# Patient Record
Sex: Male | Born: 1990 | Race: Black or African American | Hispanic: No | Marital: Single | Smoking: Never smoker
Health system: Southern US, Community
[De-identification: ages and names within clinical notes are randomized; demographics above are authoritative.]

## PROBLEM LIST (undated history)

## (undated) DIAGNOSIS — T7840XA Allergy, unspecified, initial encounter: Secondary | ICD-10-CM

---
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
## 2014-12-14 ENCOUNTER — Emergency Department (HOSPITAL_COMMUNITY)
Admission: EM | Admit: 2014-12-14 | Discharge: 2014-12-14 | Disposition: A | Discharge status: Home / Self Care | Payer: Self-pay | Source: Home / Self Care

## 2014-12-14 ENCOUNTER — Encounter (HOSPITAL_COMMUNITY): Payer: Self-pay | Admitting: Emergency Medicine

## 2014-12-14 DIAGNOSIS — R51 Headache: Secondary | ICD-10-CM

## 2014-12-14 DIAGNOSIS — R519 Headache, unspecified: Secondary | ICD-10-CM

## 2014-12-14 NOTE — ED Notes (Addendum)
Pt has a dull pain along both cheek bones since Monday and it is now in the back of his head along his occipital.  Pt denies any trauma to the head.

## 2014-12-14 NOTE — ED Provider Notes (Signed)
CSN: [id number]     Arrival date & time 12/14/14  1028 History   None    Chief Complaint  Patient presents with  . Headache  . Facial Pain   (Consider location/radiation/quality/duration/timing/severity/associated sxs/prior Treatment) HPI    4 days ago developed facial pain around cheekbones. This resolved and then developed pain in the back of the head. Achy. Intermittent. No change since onset.  Lemon juice and nyquil w/ some improvement.  Denies other pain.  Stopped All caffeinated beverages a week ago. Latex water now.  Denies fevers, rhinorrhea, sinus congestion, postnasal drip, sore throat, cough, chest pain, neck stiffness, change in mentation, change in vision or hearing, rash, nausea, vomiting, diarrhea, photosensitivity, phonosensitivity. History reviewed. No pertinent past medical history. History reviewed. No pertinent past surgical history. Family History  Problem Relation Age of Onset  . Diabetes Other   . Hypertension Other    History  Substance Use Topics  . Smoking status: Never Smoker   . Smokeless tobacco: Never Used  . Alcohol Use: No    Review of Systems Per HPI with all other pertinent systems negative.   Allergies  Review of patient's allergies indicates no known allergies.  Home Medications   Prior to Admission medications   Not on File   BP 137/96 mmHg  Pulse 89  Temp(Src) 98.8 F (37.1 C) (Oral)  Resp 16  SpO2 99% Physical Exam Physical Exam  Constitutional: oriented to person, place, and time. appears well-developed and well-nourished. No distress.  HENT:  Head: Normocephalic and atraumatic.  Eyes: EOMI. PERRL.  Neck: Normal range of motion.  Cardiovascular: RRR, no m/r/g, 2+ distal pulses,  Pulmonary/Chest: Effort normal and breath sounds normal. No respiratory distress.  Abdominal: Soft. Bowel sounds are normal. NonTTP, no distension.  Musculoskeletal: Normal range of motion. Non ttp, no effusion.  Neurological: Revealed first  to 12 grossly intact, moves all shoulders according to fashion. A O 3.  Skin: Skin is warm. No rash noted. non diaphoretic.  Psychiatric: normal mood and affect. behavior is normal. Judgment and thought content normal.   ED Course  Procedures (including critical care time) Labs Review Labs Reviewed - No data to display  Imaging Review No results found.   MDM   1. Headache, occipital    He is not immediately clear but likely due to a mild headache. Patient endorses preceding drinking 2 caffeinated beverages per day and then stop them cold [place] about a week ago. Suspect of his headache may be due to this. Patient to go home and consider adding down his dose of caffeine as well as trying some ibuprofen or Excedrin which has caffeine in it. No overt signs of intracranial process, or metabolic disorder causing symptoms. Patient ED if he gets worse. Patient to follow-up with his primary care physician    Ozella Rocksavid J Devika Dragovich, MD 12/14/14 1255

## 2014-12-14 NOTE — Discharge Instructions (Signed)
Because of her symptoms is likely from a headache. This may be migraine related. Please go home and take a full 800 mg of ibuprofen. Consider also taking a daily allergy pill if you develop further facial pain symptoms. Please follow-up the emergency room if he gets significantly worse. Please call your primary care physician for a routine checkup.

## 2017-01-21 ENCOUNTER — Encounter (HOSPITAL_BASED_OUTPATIENT_CLINIC_OR_DEPARTMENT_OTHER): Payer: Self-pay | Admitting: *Deleted

## 2017-01-21 ENCOUNTER — Emergency Department (HOSPITAL_BASED_OUTPATIENT_CLINIC_OR_DEPARTMENT_OTHER)
Admission: EM | Admit: 2017-01-21 | Discharge: 2017-01-21 | Disposition: A | Discharge status: Home / Self Care | Payer: BC Managed Care – PPO | Attending: Emergency Medicine | Admitting: Emergency Medicine

## 2017-01-21 DIAGNOSIS — H6122 Impacted cerumen, left ear: Secondary | ICD-10-CM | POA: Diagnosis not present

## 2017-01-21 DIAGNOSIS — H6192 Disorder of left external ear, unspecified: Secondary | ICD-10-CM | POA: Diagnosis present

## 2017-01-21 MED ORDER — NEOMYCIN-COLIST-HC-THONZONIUM 3.3-3-10-0.5 MG/ML OT SUSP
4.0000 [drp] | Freq: Four times a day (QID) | OTIC | Status: DC
  Administered 2017-01-21: 4 [drp] via OTIC
  Filled 2017-01-21: qty 5

## 2017-01-21 NOTE — ED Triage Notes (Signed)
Left ear stopped up x 3 hours

## 2017-01-21 NOTE — ED Provider Notes (Signed)
   [facility] Provider Note: Timothy Mack Timothy Kawahara, MD, FACEP  CSN: [id number] MRN: [id number] ARRIVAL: 01/21/17 at 0632 ROOM: [facility]   CHIEF COMPLAINT  Cerumen Impaction   HISTORY OF PRESENT ILLNESS  Timothy Mack is a 26 y.o. male who awoke about 3 hours ago with a sensation of his left ear being clogged with decreased hearing acuity. He denies pain. He denies drainage. He denies other complaint. He has no recent history of cerumen impaction although does remember having this as a child.   History reviewed. No pertinent past medical history.  No past surgical history on file.  Family History  Problem Relation Age of Onset  . Diabetes Other   . Hypertension Other     Social History  Substance Use Topics  . Smoking status: Never Smoker  . Smokeless tobacco: Never Used  . Alcohol use No    Prior to Admission medications   Not on File    Allergies Patient has no known allergies.   REVIEW OF SYSTEMS  Negative except as noted here or in the History of Present Illness.   PHYSICAL EXAMINATION  Initial Vital Signs Blood pressure (!) 143/102, pulse 81, temperature 98.4 F (36.9 C), temperature source Oral, resp. rate 16, height 5\' 10"  (1.778 m), weight 102.1 kg (225 lb), SpO2 99 %.  Examination General: Well-developed, well-nourished male in no acute distress; appearance consistent with age of record HENT: normocephalic; atraumatic; right TM normal; left TM obscured by cerumen plug Eyes: pupils equal, round and reactive to light; extraocular muscles intact Neck: supple Heart: regular rate and rhythm Lungs: clear to auscultation bilaterally Abdomen: soft; nondistended; nontender; no masses or hepatosplenomegaly; bowel sounds present Extremities: No deformity; full range of motion; pulses normal Neurologic: Awake, alert and oriented; motor function intact in all extremities and symmetric; no facial droop Skin: Warm and dry Psychiatric: Normal mood and  affect   RESULTS  Summary of this visit's results, reviewed by myself:   EKG Interpretation  Date/Time:    Ventricular Rate:    PR Interval:    QRS Duration:   QT Interval:    QTC Calculation:   R Axis:     Text Interpretation:        Laboratory Studies: No results found for this or any previous visit (from the past 24 hour(s)). Imaging Studies: No results found.  ED COURSE  Nursing notes and initial vitals signs, including pulse oximetry, reviewed.  Vitals:   01/21/17 0639  BP: (!) 143/102  Pulse: 81  Resp: 16  Temp: 98.4 F (36.9 C)  TempSrc: Oral  SpO2: 99%  Weight: 102.1 kg (225 lb)  Height: 5\' 10"  (1.778 m)    PROCEDURES   Cerumen impaction relieved after left ear irrigation by nursing staff. TM appears normal. Cortisporin Otic order to prevent postprocedural otitis externa.  ED DIAGNOSES     ICD-10-CM   1. Impacted cerumen of left ear H61.22        Timothy Mack, Timothy RuizJohn, MD 01/21/17 [phone]

## 2017-05-21 ENCOUNTER — Emergency Department (HOSPITAL_BASED_OUTPATIENT_CLINIC_OR_DEPARTMENT_OTHER): Payer: BC Managed Care – PPO

## 2017-05-21 ENCOUNTER — Encounter (HOSPITAL_BASED_OUTPATIENT_CLINIC_OR_DEPARTMENT_OTHER): Payer: Self-pay | Admitting: Emergency Medicine

## 2017-05-21 ENCOUNTER — Emergency Department (HOSPITAL_BASED_OUTPATIENT_CLINIC_OR_DEPARTMENT_OTHER)
Admission: EM | Admit: 2017-05-21 | Discharge: 2017-05-21 | Disposition: A | Discharge status: Home / Self Care | Payer: BC Managed Care – PPO | Attending: Emergency Medicine | Admitting: Emergency Medicine

## 2017-05-21 DIAGNOSIS — B349 Viral infection, unspecified: Secondary | ICD-10-CM | POA: Insufficient documentation

## 2017-05-21 DIAGNOSIS — R0789 Other chest pain: Secondary | ICD-10-CM | POA: Diagnosis not present

## 2017-05-21 DIAGNOSIS — J069 Acute upper respiratory infection, unspecified: Secondary | ICD-10-CM | POA: Diagnosis not present

## 2017-05-21 DIAGNOSIS — R05 Cough: Secondary | ICD-10-CM | POA: Diagnosis present

## 2017-05-21 DIAGNOSIS — B9789 Other viral agents as the cause of diseases classified elsewhere: Secondary | ICD-10-CM

## 2017-05-21 MED ORDER — IBUPROFEN 400 MG PO TABS
600.0000 mg | ORAL_TABLET | Freq: Once | ORAL | Status: AC
  Administered 2017-05-21: 600 mg via ORAL
  Filled 2017-05-21: qty 1

## 2017-05-21 NOTE — ED Provider Notes (Signed)
[facility] EMERGENCY DEPARTMENT Provider Note   CSN: [id number] Arrival date & time: 05/21/17  7829     History   Chief Complaint Chief Complaint  Patient presents with  . Cough    HPI  Timothy Mack is a 26 y.o. Male With no pertinent past medical history, who presents complaining of nasal congestion, dry cough and some chest discomfort since Sunday. Patient reports a few days earlier he had some sore throat which resolved but then he restarted getting this dry non-productive cough and nasal congestion worse on the right side than the left. Patient describes some chest discomfort on the left side near his shoulder, he describes it as soreness, but comes and goes, he's not been able to detect pattern, he says it's not associated with exertion or particular positions or movement. Pt reports he was able to workout at the gym Monday with no worsening in his pain. Patient  Reports he only feels short of breath when he gets into a fit of coughing, otherwise no shortness of breath. He denies any fevers or chills. Reports some associated sore throat, no ear pain. No known sick contacts. History is symptoms at home with warm tea, took one dose of DayQuil which helped. Denies lower extremity pain or swelling, recent travel or immobilization, history of PE or DVT, family or personal history of bleeding or clotting disorders, hemoptysis.        No past medical history on file.  There are no active problems to display for this patient.   No past surgical history on file.     Home Medications    Prior to Admission medications   Not on File    Family History Family History  Problem Relation Age of Onset  . Diabetes Other   . Hypertension Other     Social History Social History  Substance Use Topics  . Smoking status: Never Smoker  . Smokeless tobacco: Never Used  . Alcohol use No     Allergies   Patient has no known allergies.   Review of Systems Review  of Systems  Constitutional: Negative for chills and fever.  HENT: Positive for congestion, postnasal drip, rhinorrhea, sinus pressure and sore throat. Negative for ear discharge, ear pain, trouble swallowing and voice change.   Eyes: Negative for discharge and itching.  Respiratory: Positive for cough and shortness of breath. Negative for chest tightness.   Cardiovascular: Positive for chest pain.  Gastrointestinal: Negative for abdominal pain, diarrhea, nausea and vomiting.  Genitourinary: Negative for dysuria.  Musculoskeletal: Negative for arthralgias and myalgias.  Skin: Negative for rash.     Physical Exam Updated Vital Signs BP (!) 139/102   Pulse (!) 107   Temp 99 F (37.2 C)   Resp 20   Ht 5\' 10"  (1.778 m)   Wt 103.4 kg (228 lb)   SpO2 99%   BMI 32.71 kg/m   Physical Exam  Constitutional: He appears well-developed and well-nourished. No distress.  HENT:  Head: Normocephalic and atraumatic.  TMs clear with good landmarks, moderate nasal mucosa edema with clear rhinorrhea, posterior oropharynx clear and moist, with some erythema, no edema or exudates, uvula midline  Eyes: Right eye exhibits no discharge. Left eye exhibits no discharge.  Neck: Normal range of motion. Neck supple. No tracheal deviation present.  Cardiovascular: Normal rate, regular rhythm, normal heart sounds and intact distal pulses.   Pulmonary/Chest: Effort normal and breath sounds normal. No stridor. No respiratory distress. He has no wheezes.  He has no rales. He exhibits tenderness.  Mild chest tenderness on palpation over left apical chest, just below the shoulder  Abdominal: Soft. Bowel sounds are normal. He exhibits no distension. There is no tenderness. There is no guarding.  Musculoskeletal: He exhibits no edema or deformity.  Lymphadenopathy:    He has no cervical adenopathy.  Neurological: He is alert. Coordination normal.  Skin: Skin is warm and dry. Capillary refill takes less than 2  seconds. He is not diaphoretic.  Psychiatric: He has a normal mood and affect. His behavior is normal.  Nursing note and vitals reviewed.    ED Treatments / Results  Labs (all labs ordered are listed, but only abnormal results are displayed) Labs Reviewed - No data to display  EKG  EKG Interpretation  Date/Time:  Wednesday May 21 2017 08:58:07 EDT Ventricular Rate:  106 PR Interval:    QRS Duration: 92 QT Interval:  317 QTC Calculation: 421 R Axis:   39 Text Interpretation:  Sinus tachycardia Borderline T wave abnormalities No old tracing to compare Confirmed by Melene PlanFloyd, Dan [phone]([id number]) on 05/21/2017 9:18:59 AM       Radiology Dg Chest 2 View  Result Date: 05/21/2017 CLINICAL DATA:  Left-sided chest pain for several days EXAM: CHEST  2 VIEW COMPARISON:  02/27/2008 FINDINGS: The heart size and mediastinal contours are within normal limits. Both lungs are clear. The visualized skeletal structures are unremarkable. IMPRESSION: No active cardiopulmonary disease. Electronically Signed   By: Alcide CleverMark  Lukens M.D.   On: 05/21/2017 09:23    Procedures Procedures (including critical care time)  Medications Ordered in ED Medications  ibuprofen (ADVIL,MOTRIN) tablet 600 mg (600 mg Oral Given 05/21/17 0949)     Initial Impression / Assessment and Plan / ED Course  I have reviewed the triage vital signs and the nursing notes.  Pertinent labs & imaging results that were available during my care of the patient were reviewed by me and considered in my medical decision making (see chart for details).  Pt presents with cough, congestion and some chest discomfort. Pt is well-appearing, slightly tachycardic on initial evaluation, vitals otherwise normal, no evidence of respiratory distress, lungs CTA. Pt CXR negative for acute infiltrate. EKG shows sinus tach. Low suspicion for ACS given story, unlikely PE given minimal risk factors and no hypoxia.  Patients symptoms are consistent with URI,  likely viral etiology. Discussed that antibiotics are not indicated for viral infections. Tachycardia resolved with ibuprofen and reassurance. Pt will be discharged with symptomatic treatment.  Verbalizes understanding and is agreeable with plan. Pt is hemodynamically stable & in NAD prior to dc.   Final Clinical Impressions(s) / ED Diagnoses   Final diagnoses:  Viral URI with cough  Chest wall pain    New Prescriptions There are no discharge medications for this patient.    Dartha LodgeFord, Laetitia Schnepf N, PA-C 05/22/17 1111    Melene PlanFloyd, Dan, DO 05/22/17 [phone]

## 2017-05-21 NOTE — Discharge Instructions (Signed)
You can treat these symptoms with over-the-counter medications such as Sudafed, Flonase, Delsym cough syrup, and ibuprofen or Tylenol for pain or fevers. Symptoms are not improving by the weekend please schedule an appointment to follow-up with your family doctor. Please have your family doctor recheck your blood pressure at this time. If you develop worsening chest pain, persistent shortness of breath or other concerning symptoms please return to the emergency department for sooner reevaluation.

## 2017-05-21 NOTE — ED Notes (Signed)
ED Provider at bedside. 

## 2017-05-21 NOTE — ED Triage Notes (Signed)
Pt states he has nasal congestion, dry cough and some chest discomfort since Sunday.  No known fever.  No recent travel.

## 2017-06-26 ENCOUNTER — Encounter (HOSPITAL_BASED_OUTPATIENT_CLINIC_OR_DEPARTMENT_OTHER): Payer: Self-pay

## 2017-06-26 ENCOUNTER — Other Ambulatory Visit: Payer: Self-pay

## 2017-06-26 ENCOUNTER — Emergency Department (HOSPITAL_BASED_OUTPATIENT_CLINIC_OR_DEPARTMENT_OTHER): Payer: BC Managed Care – PPO

## 2017-06-26 ENCOUNTER — Emergency Department (HOSPITAL_BASED_OUTPATIENT_CLINIC_OR_DEPARTMENT_OTHER)
Admission: EM | Admit: 2017-06-26 | Discharge: 2017-06-26 | Disposition: A | Discharge status: Home / Self Care | Payer: BC Managed Care – PPO | Attending: Emergency Medicine | Admitting: Emergency Medicine

## 2017-06-26 DIAGNOSIS — J029 Acute pharyngitis, unspecified: Secondary | ICD-10-CM | POA: Diagnosis not present

## 2017-06-26 DIAGNOSIS — J40 Bronchitis, not specified as acute or chronic: Secondary | ICD-10-CM | POA: Insufficient documentation

## 2017-06-26 DIAGNOSIS — R079 Chest pain, unspecified: Secondary | ICD-10-CM | POA: Insufficient documentation

## 2017-06-26 DIAGNOSIS — R05 Cough: Secondary | ICD-10-CM | POA: Diagnosis present

## 2017-06-26 MED ORDER — AEROCHAMBER PLUS FLO-VU MEDIUM MISC
1.0000 | Freq: Once | Status: AC
  Administered 2017-06-26: 1
  Filled 2017-06-26: qty 1

## 2017-06-26 MED ORDER — PREDNISONE 10 MG PO TABS: 40.0000 mg | ORAL_TABLET | Freq: Every day | ORAL | 0 refills | Status: AC

## 2017-06-26 MED ORDER — DEXAMETHASONE SODIUM PHOSPHATE 10 MG/ML IJ SOLN
10.0000 mg | Freq: Once | INTRAMUSCULAR | Status: AC
  Administered 2017-06-26: 10 mg via INTRAMUSCULAR
  Filled 2017-06-26: qty 1

## 2017-06-26 MED ORDER — ALBUTEROL SULFATE HFA 108 (90 BASE) MCG/ACT IN AERS
1.0000 | INHALATION_SPRAY | Freq: Once | RESPIRATORY_TRACT | Status: AC
  Administered 2017-06-26: 1 via RESPIRATORY_TRACT
  Filled 2017-06-26: qty 6.7

## 2017-06-26 MED FILL — predniSONE 10 MG TABS: 10 | 5 days supply | Qty: 20 | Fill #0

## 2017-06-26 NOTE — ED Provider Notes (Signed)
[facility] EMERGENCY DEPARTMENT Provider Note   CSN: [id number] Arrival date & time: 06/26/17  1607     History   Chief Complaint Chief Complaint  Patient presents with  . Cough    HPI Timothy Mack is a 26 y.o. male presenting with cough.  Patient states that he developed URI symptoms about a month ago.  He had nasal congestion, sore throat, and cough.  Since then, all symptoms have resolved except the cough.  He states it is nonproductive, worse at night.  He noted some improvement with Robitussin the last several days.  He has not taken anything else for his symptoms.  He states today he had worsening left-sided chest pain with coughing and movement of his arm.  There is no pain at rest.  He denies fevers, chills, sore throat, ear pain, difficulty breathing, shortness of breath, nausea, vomiting, abdominal pain, urinary symptoms, abnormal bowel movements.  He has no other medical history, does not take medications daily.   HPI  History reviewed. No pertinent past medical history.  There are no active problems to display for this patient.   History reviewed. No pertinent surgical history.     Home Medications    Prior to Admission medications   Medication Sig Start Date End Date Taking? Authorizing Provider  predniSONE (DELTASONE) 10 MG tablet Take 4 tablets (40 mg total) by mouth daily for 5 days. 06/26/17 07/01/17  Chanin Frumkin, PA-C    Family History Family History  Problem Relation Age of Onset  . Diabetes Other   . Hypertension Other     Social History Social History   Tobacco Use  . Smoking status: Never Smoker  . Smokeless tobacco: Never Used  Substance Use Topics  . Alcohol use: No  . Drug use: No     Allergies   Patient has no known allergies.   Review of Systems Review of Systems  Constitutional: Negative for chills and fever.  Respiratory: Positive for cough. Negative for chest tightness and shortness of breath.     Cardiovascular: Positive for chest pain. Negative for palpitations and leg swelling.     Physical Exam Updated Vital Signs BP (!) 139/96 (BP Location: Left Arm)   Pulse 87   Temp 98.5 F (36.9 C) (Oral)   Resp 18   Ht 5\' 11"  (1.803 m)   Wt 98.4 kg (217 lb)   SpO2 100%   BMI 30.27 kg/m   Physical Exam  Constitutional: He is oriented to person, place, and time. He appears well-developed and well-nourished. No distress.  HENT:  Head: Normocephalic and atraumatic.  Right Ear: Tympanic membrane, external ear and ear canal normal.  Left Ear: Tympanic membrane, external ear and ear canal normal.  Nose: Mucosal edema present. Right sinus exhibits no maxillary sinus tenderness and no frontal sinus tenderness. Left sinus exhibits no maxillary sinus tenderness and no frontal sinus tenderness.  Mouth/Throat: Uvula is midline, oropharynx is clear and moist and mucous membranes are normal. No tonsillar exudate.  Nasal mucosal edema.  No frontal tenderness.  OP clear without tonsillar swelling or exudate.  Eyes: Conjunctivae and EOM are normal. Pupils are equal, round, and reactive to light.  Neck: Normal range of motion.  Cardiovascular: Normal rate, regular rhythm and intact distal pulses.  Pulmonary/Chest: Effort normal and breath sounds normal. He has no decreased breath sounds. He has no wheezes. He has no rhonchi. He has no rales. He exhibits tenderness.  Pt speaking in full sentences without difficulty.  Lung sounds clear in all fields. TTP of the L chest wall musculature and pain with movement of the L arm.   Abdominal: Soft. He exhibits no distension. There is no tenderness.  Musculoskeletal: Normal range of motion.  Lymphadenopathy:    He has no cervical adenopathy.  Neurological: He is alert and oriented to person, place, and time.  Skin: Skin is warm.  Psychiatric: He has a normal mood and affect.  Nursing note and vitals reviewed.    ED Treatments / Results  Labs (all labs  ordered are listed, but only abnormal results are displayed) Labs Reviewed - No data to display  EKG  EKG Interpretation None       Radiology Dg Chest 2 View  Result Date: 06/26/2017 CLINICAL DATA:  Cough for 1 month EXAM: CHEST  2 VIEW COMPARISON:  05/21/2017 FINDINGS: The heart size and mediastinal contours are within normal limits. Both lungs are clear. The visualized skeletal structures are unremarkable. IMPRESSION: No active cardiopulmonary disease. Electronically Signed   By: Alcide CleverMark  Lukens M.D.   On: 06/26/2017 16:43    Procedures Procedures (including critical care time)  Medications Ordered in ED Medications  dexamethasone (DECADRON) injection 10 mg (10 mg Intramuscular Given 06/26/17 1703)  albuterol (PROVENTIL HFA;VENTOLIN HFA) 108 (90 Base) MCG/ACT inhaler 1 puff (1 puff Inhalation Given 06/26/17 1709)  AEROCHAMBER PLUS FLO-VU MEDIUM MISC 1 each (1 each Other Given 06/26/17 1709)     Initial Impression / Assessment and Plan / ED Course  I have reviewed the triage vital signs and the nursing notes.  Pertinent labs & imaging results that were available during my care of the patient were reviewed by me and considered in my medical decision making (see chart for details).     Patient presenting with persistent cough.  Physical exam reassuring, patient is afebrile and appears nontoxic.  Pulmonary exam reassuring.  CXR negative. Doubt PE, pneumonia, strep, other bacterial infection, or peritonsillar abscess. TTP of chest musculature, and pain with movement of arm. Likely musculature, doubt ACS or cardiac etiology. Likely bornchitis.  Will treat with Decadron, prednisone prescription, albuterol, and patient to continue taking cough syrup.  Patient to follow-up with primary care as needed.  At this time, patient appears safe for discharge.  Return precautions given.  Patient states he understands and agrees to plan.   Final Clinical Impressions(s) / ED Diagnoses   Final  diagnoses:  Bronchitis    ED Discharge Orders        Ordered    predniSONE (DELTASONE) 10 MG tablet  Daily     06/26/17 1700       Alveria ApleyCaccavale, Sharona Rovner, PA-C 06/27/17 0037    Maia PlanLong, Joshua G, MD 06/27/17 1019

## 2017-06-26 NOTE — Discharge Instructions (Signed)
Take prednisone as prescribed. Use the inhaler 3 times a day for the next several days and as needed afterwards.  You likely have a viral illness, and the goal is to prevent coughing. Use Tylenol or ibuprofen as needed for fevers or body aches. Continue to take robitussin nightly. Make sure you stay well-hydrated with water. Wash your hands frequently to prevent spread of infection. Follow-up with your primary care doctor in 1 week if your symptoms are not improving. Return to the emergency room if you develop chest pain, difficulty breathing, or any new or worsening symptoms.

## 2017-06-26 NOTE — ED Notes (Signed)
ED Provider at bedside. 

## 2017-06-26 NOTE — ED Triage Notes (Signed)
C/o cont'd dry cough-seen here 10/31 for same-states he now feels has pulled muscle to left chest area-NAD-steady gait

## 2017-11-01 ENCOUNTER — Emergency Department (HOSPITAL_BASED_OUTPATIENT_CLINIC_OR_DEPARTMENT_OTHER)
Admission: EM | Admit: 2017-11-01 | Discharge: 2017-11-01 | Disposition: A | Discharge status: Home / Self Care | Payer: BC Managed Care – PPO | Attending: Emergency Medicine | Admitting: Emergency Medicine

## 2017-11-01 ENCOUNTER — Other Ambulatory Visit: Payer: Self-pay

## 2017-11-01 ENCOUNTER — Emergency Department (HOSPITAL_BASED_OUTPATIENT_CLINIC_OR_DEPARTMENT_OTHER): Payer: BC Managed Care – PPO

## 2017-11-01 ENCOUNTER — Encounter (HOSPITAL_BASED_OUTPATIENT_CLINIC_OR_DEPARTMENT_OTHER): Payer: Self-pay | Admitting: Adult Health

## 2017-11-01 DIAGNOSIS — R0789 Other chest pain: Secondary | ICD-10-CM | POA: Diagnosis not present

## 2017-11-01 DIAGNOSIS — R079 Chest pain, unspecified: Secondary | ICD-10-CM | POA: Diagnosis present

## 2017-11-01 LAB — BASIC METABOLIC PANEL
Anion gap: 8 (ref 5–15)
BUN: 11 mg/dL (ref 6–20)
CO2: 25 mmol/L (ref 22–32)
Calcium: 9 mg/dL (ref 8.9–10.3)
Chloride: 103 mmol/L (ref 101–111)
Creatinine, Ser: 1.1 mg/dL (ref 0.61–1.24)
GFR calc Af Amer: >60 mL/min (ref >60)
GLUCOSE: 103 mg/dL — AB (ref 65–99)
POTASSIUM: 3.5 mmol/L (ref 3.5–5.1)
Sodium: 136 mmol/L (ref 135–145)

## 2017-11-01 LAB — CBC WITH DIFFERENTIAL/PLATELET
BASOS ABS: 0.1 10*3/uL (ref 0.0–0.1)
BASOS PCT: 1 %
Eosinophils Absolute: 0.1 10*3/uL (ref 0.0–0.7)
Eosinophils Relative: 2 %
HEMATOCRIT: 47.3 % (ref 39.0–52.0)
Hemoglobin: 16.4 g/dL (ref 13.0–17.0)
Lymphocytes Relative: 39 %
Lymphs Abs: 2.6 10*3/uL (ref 0.7–4.0)
MCH: 30.1 pg (ref 26.0–34.0)
MCHC: 34.7 g/dL (ref 30.0–36.0)
MCV: 86.8 fL (ref 78.0–100.0)
MONOS PCT: 9 %
Monocytes Absolute: 0.6 10*3/uL (ref 0.1–1.0)
NEUTROS ABS: 3.2 10*3/uL (ref 1.7–7.7)
Neutrophils Relative %: 49 %
Platelets: 178 10*3/uL (ref 150–400)
RBC: 5.45 MIL/uL (ref 4.22–5.81)
RDW: 12.7 % (ref 11.5–15.5)
WBC: 6.6 10*3/uL (ref 4.0–10.5)

## 2017-11-01 LAB — TROPONIN I: Troponin I: <0.03 ng/mL (ref <0.03)

## 2017-11-01 LAB — D-DIMER, QUANTITATIVE (NOT AT ARMC)

## 2017-11-01 MED ORDER — CYCLOBENZAPRINE HCL 10 MG PO TABS: 10.0000 mg | ORAL_TABLET | Freq: Two times a day (BID) | ORAL | 0 refills | Status: DC | PRN

## 2017-11-01 MED ORDER — IBUPROFEN 600 MG PO TABS: 600.0000 mg | ORAL_TABLET | Freq: Four times a day (QID) | ORAL | 0 refills | Status: DC | PRN

## 2017-11-01 NOTE — ED Provider Notes (Signed)
[facility] EMERGENCY DEPARTMENT Provider Note   CSN: [id number] Arrival date & time: 11/01/17  2033     History   Chief Complaint Chief Complaint  Patient presents with  . Chest Pain    HPI Timothy Mack is a 27 y.o. male.  Pt presents to the ED today with chest pain.  Pt said it has been going on for about 2 weeks.  He said it is mainly in the left side.  It feels better with icy hot and with epsom salts, but it does not go away.  No sob.  No n/v.     History reviewed. No pertinent past medical history.  There are no active problems to display for this patient.   History reviewed. No pertinent surgical history.      Home Medications    Prior to Admission medications   Medication Sig Start Date End Date Taking? Authorizing Provider  cetirizine (ZYRTEC) 10 MG tablet Take 10 mg by mouth daily.   Yes [provider]  fluticasone (FLONASE) 50 MCG/ACT nasal spray Place 2 sprays into both nostrils daily.   Yes [provider]  cyclobenzaprine (FLEXERIL) 10 MG tablet Take 1 tablet (10 mg total) by mouth 2 (two) times daily as needed for muscle spasms. 11/01/17   Jacalyn Lefevre, MD  ibuprofen (ADVIL,MOTRIN) 600 MG tablet Take 1 tablet (600 mg total) by mouth every 6 (six) hours as needed. 11/01/17   Jacalyn Lefevre, MD    Family History Family History  Problem Relation Age of Onset  . Diabetes Other   . Hypertension Other     Social History Social History   Tobacco Use  . Smoking status: Never Smoker  . Smokeless tobacco: Never Used  Substance Use Topics  . Alcohol use: No  . Drug use: No     Allergies   Patient has no known allergies.   Review of Systems Review of Systems  Cardiovascular: Positive for chest pain.  All other systems reviewed and are negative.    Physical Exam Updated Vital Signs BP (!) 162/93   Pulse 90   Temp 98.1 F (36.7 C) (Oral)   Resp 20   Ht 5\' 11"  (1.803 m)   Wt 102.1 kg (225 lb)   SpO2  100%   BMI 31.38 kg/m   Physical Exam  Constitutional: He is oriented to person, place, and time. He appears well-developed and well-nourished.  HENT:  Head: Normocephalic and atraumatic.  Eyes: Pupils are equal, round, and reactive to light. EOM are normal.  Neck: Normal range of motion. Neck supple.  Cardiovascular: Normal rate, regular rhythm, intact distal pulses and normal pulses.  Pulmonary/Chest: Effort normal and breath sounds normal.  Abdominal: Soft. Bowel sounds are normal.  Musculoskeletal: Normal range of motion.       Right lower leg: Normal.       Left lower leg: Normal.  Neurological: He is alert and oriented to person, place, and time.  Skin: Skin is warm and dry. Capillary refill takes less than 2 seconds.  Psychiatric: He has a normal mood and affect. His behavior is normal.  Nursing note and vitals reviewed.    ED Treatments / Results  Labs (all labs ordered are listed, but only abnormal results are displayed) Labs Reviewed  BASIC METABOLIC PANEL - Abnormal; Notable for the following components:      Result Value   Glucose, Bld 103 (*)    All other components within normal limits  CBC WITH  DIFFERENTIAL/PLATELET  TROPONIN I  D-DIMER, QUANTITATIVE (NOT AT [facility])    EKG EKG Interpretation  Date/Time:  Saturday November 01 2017 20:39:07 EDT Ventricular Rate:  91 PR Interval:  144 QRS Duration: 94 QT Interval:  306 QTC Calculation: 376 R Axis:   49 Text Interpretation:  Normal sinus rhythm with sinus arrhythmia Nonspecific T wave abnormality Abnormal ECG No significant change since last tracing Confirmed by Jacalyn LefevreHaviland, Romeo Zielinski [phone]([id number]) on 11/01/2017 8:51:28 PM   Radiology Dg Chest 2 View  Result Date: 11/01/2017 CLINICAL DATA:  Chest pain EXAM: CHEST - 2 VIEW COMPARISON:  06/26/2017 FINDINGS: Lungs are clear.  No pleural effusion or pneumothorax. The heart is normal in size. Visualized osseous structures are within normal limits. IMPRESSION: Normal chest  radiographs. Electronically Signed   By: Charline BillsSriyesh  Krishnan M.D.   On: 11/01/2017 21:17    Procedures Procedures (including critical care time)  Medications Ordered in ED Medications - No data to display   Initial Impression / Assessment and Plan / ED Course  I have reviewed the triage vital signs and the nursing notes.  Pertinent labs & imaging results that were available during my care of the patient were reviewed by me and considered in my medical decision making (see chart for details).    Pt's work up is negative.  Pain is atypical.  Heart score of 0.  Pt is stable for d/c.  Return if worse.  F/u with pcp.  Final Clinical Impressions(s) / ED Diagnoses   Final diagnoses:  Atypical chest pain    ED Discharge Orders        Ordered    ibuprofen (ADVIL,MOTRIN) 600 MG tablet  Every 6 hours PRN     11/01/17 2219    cyclobenzaprine (FLEXERIL) 10 MG tablet  2 times daily PRN     11/01/17 2219       Jacalyn LefevreHaviland, Liset Mcmonigle, MD 11/01/17 2220

## 2017-11-01 NOTE — ED Notes (Signed)
Pt and mother given d/c instructions as per chart. Rx x 2 with precautions. Verbalizes understanding. No questions. 

## 2017-11-01 NOTE — ED Triage Notes (Signed)
PResents with fatigue and pain in the upper left flank and back, worse with deep inspiration,, made better with epsom salt and icy hot. He denies SOB, nausea and dizziness.

## 2017-11-16 ENCOUNTER — Emergency Department (HOSPITAL_BASED_OUTPATIENT_CLINIC_OR_DEPARTMENT_OTHER)
Admission: EM | Admit: 2017-11-16 | Discharge: 2017-11-16 | Disposition: A | Discharge status: Home / Self Care | Payer: BC Managed Care – PPO | Attending: Emergency Medicine | Admitting: Emergency Medicine

## 2017-11-16 ENCOUNTER — Encounter (HOSPITAL_BASED_OUTPATIENT_CLINIC_OR_DEPARTMENT_OTHER): Payer: Self-pay | Admitting: Emergency Medicine

## 2017-11-16 ENCOUNTER — Other Ambulatory Visit: Payer: Self-pay

## 2017-11-16 DIAGNOSIS — Y92410 Unspecified street and highway as the place of occurrence of the external cause: Secondary | ICD-10-CM | POA: Diagnosis not present

## 2017-11-16 DIAGNOSIS — Y9389 Activity, other specified: Secondary | ICD-10-CM | POA: Insufficient documentation

## 2017-11-16 DIAGNOSIS — Z79899 Other long term (current) drug therapy: Secondary | ICD-10-CM | POA: Diagnosis not present

## 2017-11-16 DIAGNOSIS — S161XXA Strain of muscle, fascia and tendon at neck level, initial encounter: Secondary | ICD-10-CM | POA: Insufficient documentation

## 2017-11-16 DIAGNOSIS — Y999 Unspecified external cause status: Secondary | ICD-10-CM | POA: Insufficient documentation

## 2017-11-16 DIAGNOSIS — R51 Headache: Secondary | ICD-10-CM | POA: Diagnosis not present

## 2017-11-16 DIAGNOSIS — S199XXA Unspecified injury of neck, initial encounter: Secondary | ICD-10-CM | POA: Diagnosis present

## 2017-11-16 MED ORDER — IBUPROFEN 800 MG PO TABS
800.0000 mg | ORAL_TABLET | Freq: Once | ORAL | Status: AC
  Administered 2017-11-16: 800 mg via ORAL
  Filled 2017-11-16: qty 1

## 2017-11-16 MED ORDER — CYCLOBENZAPRINE HCL 10 MG PO TABS: 10.0000 mg | ORAL_TABLET | Freq: Two times a day (BID) | ORAL | 0 refills | Status: DC | PRN

## 2017-11-16 MED ORDER — CYCLOBENZAPRINE HCL 10 MG PO TABS
10.0000 mg | ORAL_TABLET | Freq: Once | ORAL | Status: AC
  Administered 2017-11-16: 10 mg via ORAL
  Filled 2017-11-16: qty 1

## 2017-11-16 MED ORDER — IBUPROFEN 800 MG PO TABS: 800.0000 mg | ORAL_TABLET | Freq: Three times a day (TID) | ORAL | 0 refills | Status: DC
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
## 2018-03-01 ENCOUNTER — Emergency Department (HOSPITAL_BASED_OUTPATIENT_CLINIC_OR_DEPARTMENT_OTHER)
Admission: EM | Admit: 2018-03-01 | Discharge: 2018-03-01 | Disposition: A | Discharge status: Home / Self Care | Payer: BC Managed Care – PPO | Attending: Emergency Medicine | Admitting: Emergency Medicine

## 2018-03-01 ENCOUNTER — Other Ambulatory Visit: Payer: Self-pay

## 2018-03-01 ENCOUNTER — Encounter (HOSPITAL_BASED_OUTPATIENT_CLINIC_OR_DEPARTMENT_OTHER): Payer: Self-pay | Admitting: Emergency Medicine

## 2018-03-01 DIAGNOSIS — H1013 Acute atopic conjunctivitis, bilateral: Secondary | ICD-10-CM | POA: Diagnosis not present

## 2018-03-01 DIAGNOSIS — Z79899 Other long term (current) drug therapy: Secondary | ICD-10-CM | POA: Insufficient documentation

## 2018-03-01 DIAGNOSIS — H5789 Other specified disorders of eye and adnexa: Secondary | ICD-10-CM | POA: Diagnosis present

## 2018-03-01 NOTE — Discharge Instructions (Signed)
If you develop eye pain, blurry vision, matting of the eye or eye drainage that is not clear or any other new/concerning symptoms and return to the ER for evaluation.  Do not wear contacts during this time.

## 2018-03-01 NOTE — ED Provider Notes (Signed)
[facility] EMERGENCY DEPARTMENT Provider Note   CSN: [id number] Arrival date & time: 03/01/18  1905     History   Chief Complaint Chief Complaint  Patient presents with  . Eye Problem    HPI Dorin R Klemann is a 27 y.o. male.  HPI  27 year old male presents with eye redness.  Has been significant for the last 2 days and mostly right-sided.  However over the last couple weeks he has woken up with some eye redness bilaterally.  No fevers, headache, green or yellow discharge, matting.  No blurry vision.  No ocular pain but has been having some itching and watery discharge.  Some infraorbital swelling and some right maxillary sinus pressure.  Some nasal congestion and postnasal drip along with sneezing.  History reviewed. No pertinent past medical history.  There are no active problems to display for this patient.   History reviewed. No pertinent surgical history.      Home Medications    Prior to Admission medications   Medication Sig Start Date End Date Taking? Authorizing Provider  cetirizine (ZYRTEC) 10 MG tablet Take 10 mg by mouth daily.    [provider]  cyclobenzaprine (FLEXERIL) 10 MG tablet Take 1 tablet (10 mg total) by mouth 2 (two) times daily as needed for muscle spasms. 11/16/17   McDonald, Mia A, PA-C  fluticasone (FLONASE) 50 MCG/ACT nasal spray Place 2 sprays into both nostrils daily.    [provider]  ibuprofen (ADVIL,MOTRIN) 800 MG tablet Take 1 tablet (800 mg total) by mouth 3 (three) times daily. 11/16/17   McDonald, Mia A, PA-C    Family History Family History  Problem Relation Age of Onset  . Diabetes Other   . Hypertension Other     Social History Social History   Tobacco Use  . Smoking status: Never Smoker  . Smokeless tobacco: Never Used  Substance Use Topics  . Alcohol use: No  . Drug use: No     Allergies   Patient has no known allergies.   Review of Systems Review of Systems  Constitutional:  Negative for fever.  HENT: Positive for congestion, rhinorrhea, sneezing and sore throat.   Eyes: Positive for discharge (clear), redness and itching. Negative for pain and visual disturbance.  Respiratory: Negative for cough and shortness of breath.   Neurological: Negative for headaches.     Physical Exam Updated Vital Signs BP (!) 145/95 (BP Location: Left Arm)   Pulse 88   Temp 98.5 F (36.9 C) (Oral)   Resp 18   Ht 5\' 11"  (1.803 m)   Wt 95.3 kg   SpO2 100%   BMI 29.29 kg/m   Physical Exam  Constitutional: He appears well-developed and well-nourished.  HENT:  Head: Normocephalic and atraumatic.  Right Ear: External ear normal.  Left Ear: External ear normal.  Nose: Nose normal.  Eyes: Pupils are equal, round, and reactive to light. EOM are normal. Right eye exhibits no discharge. Left eye exhibits no discharge. Right conjunctiva is injected.    Mild right conjunctival injection medial to iris. No diffuse injection. No periorbital swelling or discharge  Neck: Neck supple.  Pulmonary/Chest: Effort normal.  Abdominal: He exhibits no distension.  Neurological: He is alert.  Skin: Skin is warm and dry. No erythema.  Nursing note and vitals reviewed.    ED Treatments / Results  Labs (all labs ordered are listed, but only abnormal results are displayed) Labs Reviewed - No data to display  EKG None  Radiology No results found.  Procedures Procedures (including critical care time)  Medications Ordered in ED Medications - No data to display   Initial Impression / Assessment and Plan / ED Course  I have reviewed the triage vital signs and the nursing notes.  Pertinent labs & imaging results that were available during my care of the patient were reviewed by me and considered in my medical decision making (see chart for details).     Minimal conjunctival injection.  His symptoms sound allergic versus mild URI.  Does not appear to be a bacterial conjunctivitis  and probably not even a viral conjunctivitis.  I discussed using antihistamines and other supportive care for his symptoms.  He is due to get an eye exam and so I have encouraged him to follow-up with his eye specialist and otherwise can follow-up with PCP.  Discussed return precautions but he has no pain, blurry vision, or other concerning signs or symptoms to suggest a true ophthalmologic emergency.  Final Clinical Impressions(s) / ED Diagnoses   Final diagnoses:  Allergic conjunctivitis of both eyes    ED Discharge Orders    None       Pricilla LovelessGoldston, Akif Weldy, MD 03/01/18 2203

## 2018-03-01 NOTE — ED Triage Notes (Signed)
Patient states that he has had intermittent eye redness to his bilateral eyes x 2 weeks - he reports that his right eye has been worse today

## 2019-06-05 IMAGING — DX DG CHEST 2V
2 series · 2 of 2 positions shown · non-contrast
Comparison: 06/26/2017

CLINICAL DATA: Chest pain

EXAM:
CHEST - 2 VIEW

[chest pa]
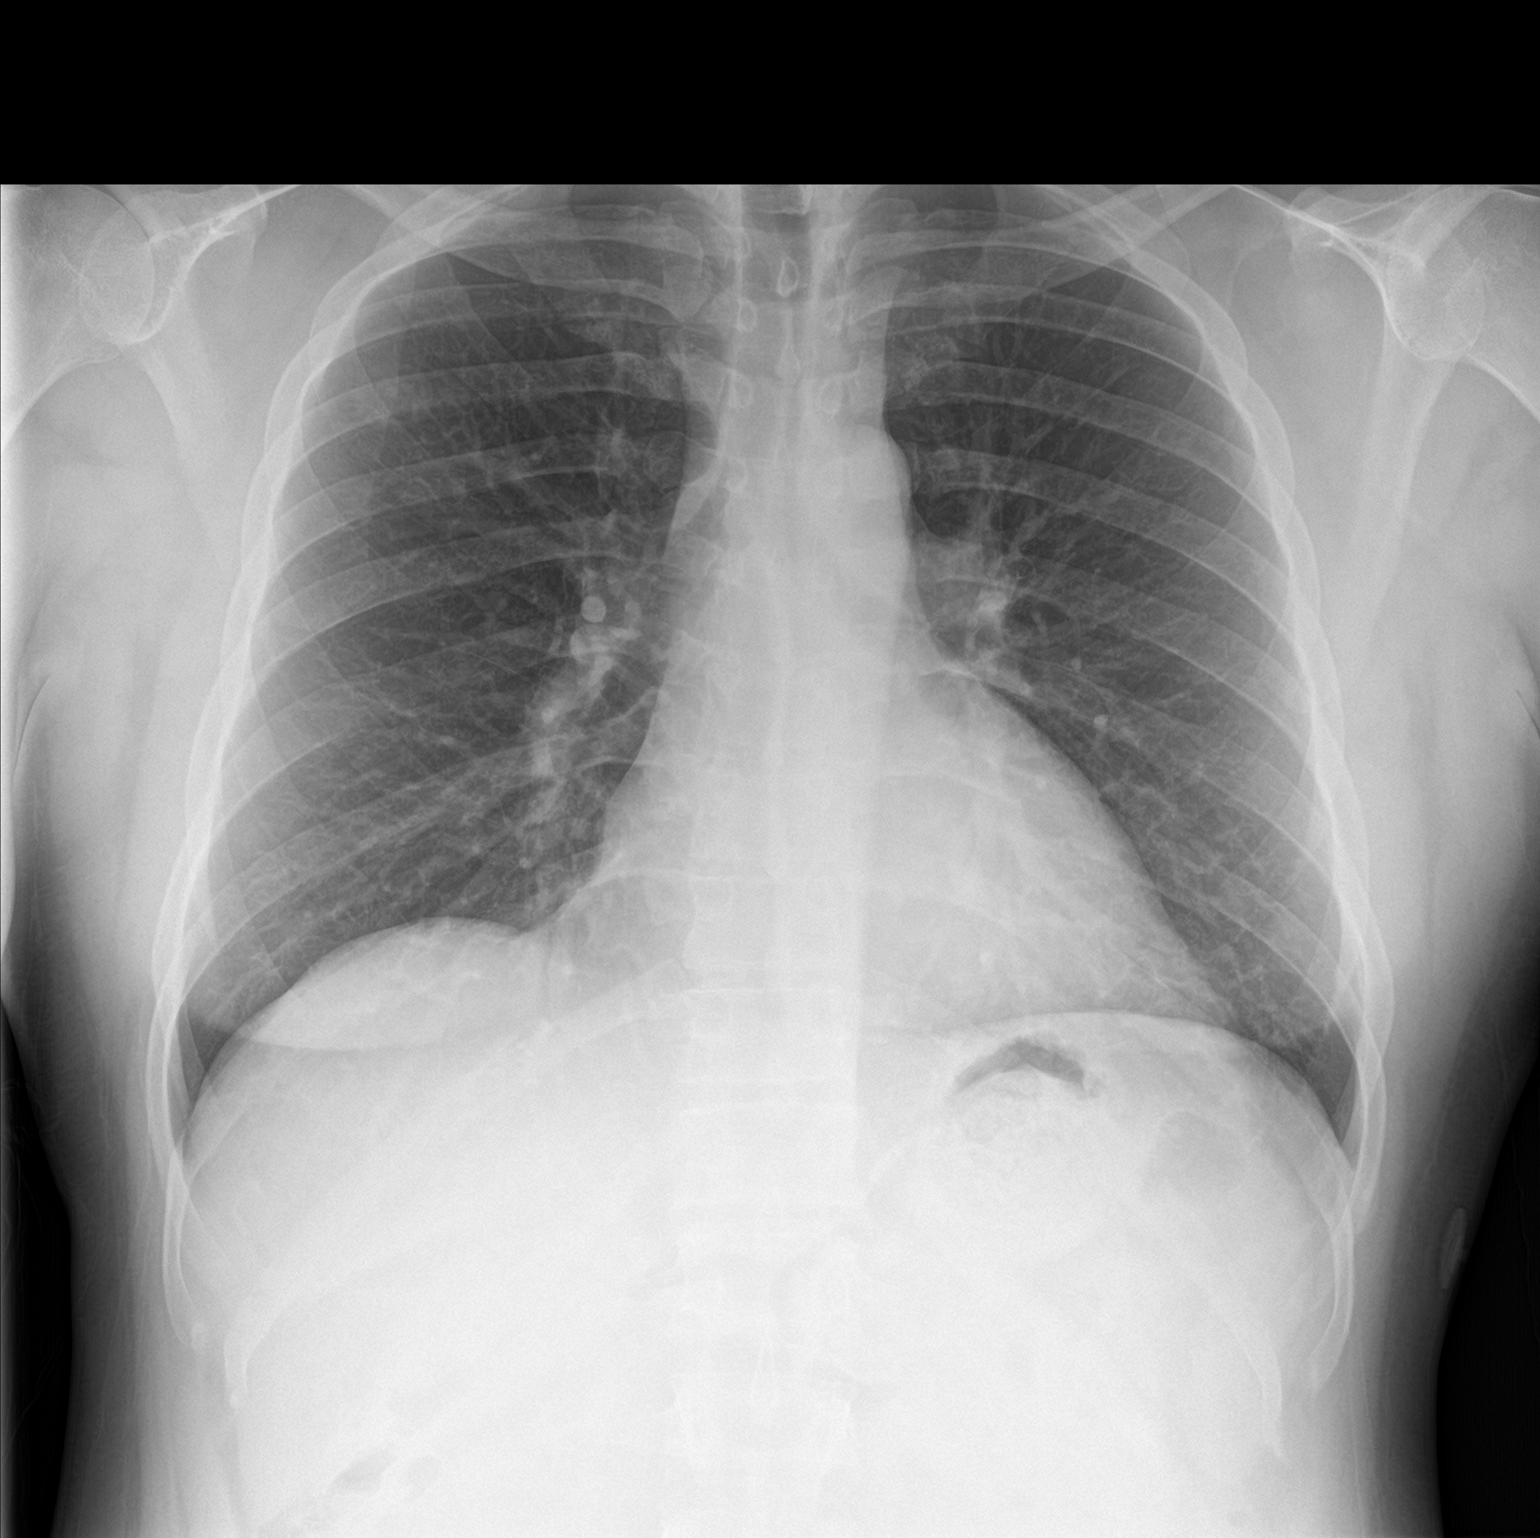

[chest lat]
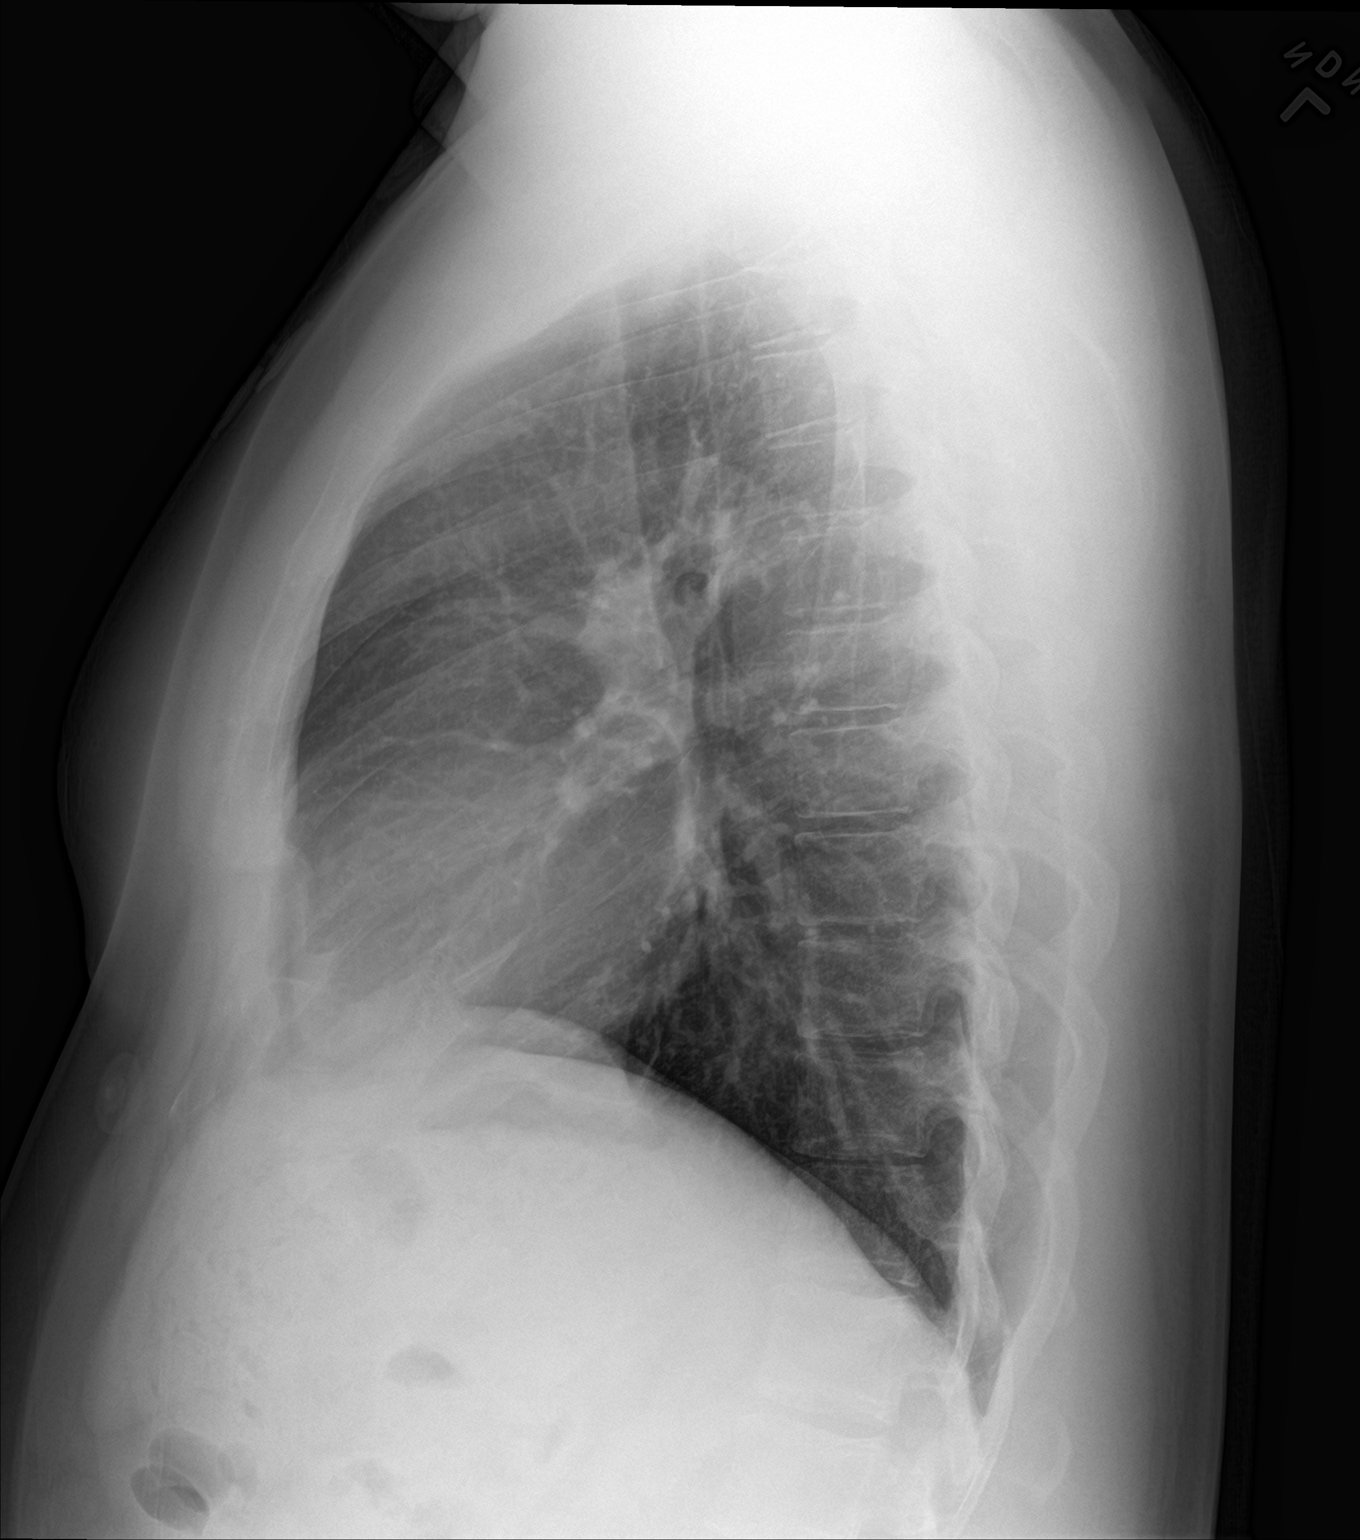

[2 of 2 positions shown; findings below may reference images not displayed]

FINDINGS: Lungs are clear.  No pleural effusion or pneumothorax.

The heart is normal in size.

Visualized osseous structures are within normal limits.
IMPRESSION: Normal chest radiographs.

## 2019-09-30 ENCOUNTER — Ambulatory Visit: Payer: BC Managed Care – PPO | Attending: Internal Medicine

## 2019-09-30 DIAGNOSIS — Z23 Encounter for immunization: Secondary | ICD-10-CM

## 2019-09-30 NOTE — Progress Notes (Signed)
   Covid-19 Vaccination Clinic  Name:  Timothy Mack    MRN: [id number] DOB: 30-Apr-1991  09/30/2019  Mr. Schank was observed post Covid-19 immunization for 15 minutes without incident. He was provided with Vaccine Information Sheet and instruction to access the V-Safe system.   Mr. Haydel was instructed to call 911 with any severe reactions post vaccine: Marland Kitchen Difficulty breathing  . Swelling of face and throat  . A fast heartbeat  . A bad rash all over body  . Dizziness and weakness   Immunizations Administered    Name Date Dose VIS Date Route   Pfizer COVID-19 Vaccine 09/30/2019  3:26 PM 0.3 mL 07/02/2019 Intramuscular   Manufacturer: [employer], [employer]   Lot: [id number]   NDC: [address]-7

## 2019-10-25 ENCOUNTER — Ambulatory Visit: Payer: BC Managed Care – PPO | Attending: Internal Medicine

## 2019-10-25 DIAGNOSIS — Z23 Encounter for immunization: Secondary | ICD-10-CM

## 2019-10-25 NOTE — Progress Notes (Signed)
   Covid-19 Vaccination Clinic  Name:  Timothy Mack    MRN: [id number] DOB: 1991/06/22  10/25/2019  Mr. [name] was observed post Covid-19 immunization for 15 minutes without incident. He was provided with Vaccine Information Sheet and instruction to access the V-Safe system.   Mr. [name] was instructed to call 911 with any severe reactions post vaccine: Marland Kitchen Difficulty breathing  . Swelling of face and throat  . A fast heartbeat  . A bad rash all over body  . Dizziness and weakness   Immunizations Administered    Name Date Dose VIS Date Route   Pfizer COVID-19 Vaccine 10/25/2019 10:58 AM 0.3 mL 07/02/2019 Intramuscular   Manufacturer: [employer], [employer]   Lot: [id number]   NDC: [address]-5

## 2020-08-06 ENCOUNTER — Other Ambulatory Visit: Payer: Self-pay

## 2020-08-06 ENCOUNTER — Emergency Department (HOSPITAL_COMMUNITY)
Admission: EM | Admit: 2020-08-06 | Discharge: 2020-08-06 | Disposition: A | Discharge status: Home / Self Care | Payer: BC Managed Care – PPO | Attending: Emergency Medicine | Admitting: Emergency Medicine

## 2020-08-06 ENCOUNTER — Emergency Department (HOSPITAL_COMMUNITY): Payer: BC Managed Care – PPO

## 2020-08-06 ENCOUNTER — Encounter (HOSPITAL_COMMUNITY): Payer: Self-pay | Admitting: Emergency Medicine

## 2020-08-06 DIAGNOSIS — R61 Generalized hyperhidrosis: Secondary | ICD-10-CM | POA: Diagnosis not present

## 2020-08-06 DIAGNOSIS — R55 Syncope and collapse: Secondary | ICD-10-CM | POA: Insufficient documentation

## 2020-08-06 HISTORY — DX: Allergy, unspecified, initial encounter: T78.40XA

## 2020-08-06 LAB — CBC
HCT: 47.3 % (ref 39.0–52.0)
Hemoglobin: 16.3 g/dL (ref 13.0–17.0)
MCH: 30.9 pg (ref 26.0–34.0)
MCHC: 34.5 g/dL (ref 30.0–36.0)
MCV: 89.6 fL (ref 80.0–100.0)
Platelets: 154 10*3/uL (ref 150–400)
RBC: 5.28 MIL/uL (ref 4.22–5.81)
RDW: 12.3 % (ref 11.5–15.5)
WBC: 5.7 10*3/uL (ref 4.0–10.5)
nRBC: 0 % (ref 0.0–0.2)

## 2020-08-06 LAB — BASIC METABOLIC PANEL
Anion gap: 9 (ref 5–15)
BUN: 9 mg/dL (ref 6–20)
CO2: 27 mmol/L (ref 22–32)
Calcium: 9 mg/dL (ref 8.9–10.3)
Chloride: 102 mmol/L (ref 98–111)
Creatinine, Ser: 0.98 mg/dL (ref 0.61–1.24)
GFR, Estimated: >60 mL/min (ref >60)
Glucose, Bld: 105 mg/dL — ABNORMAL HIGH (ref 70–99)
Potassium: 3.8 mmol/L (ref 3.5–5.1)
Sodium: 138 mmol/L (ref 135–145)

## 2020-08-06 LAB — CBG MONITORING, ED: Glucose-Capillary: 74 mg/dL (ref 70–99)

## 2020-08-06 LAB — TROPONIN I (HIGH SENSITIVITY): Troponin I (High Sensitivity): 3 ng/L (ref <18)

## 2020-08-06 MED ORDER — LACTATED RINGERS IV BOLUS
1000.0000 mL | Freq: Once | INTRAVENOUS | Status: AC
  Administered 2020-08-06: 1000 mL via INTRAVENOUS

## 2020-08-06 NOTE — ED Triage Notes (Addendum)
Pt to triage via [facility] from home.  Felt the room spinning and had a syncopal episode.  EMS reports headache and diaphoresis.  Pt denies pain or dizziness at this time.   18g LAC CBG 184.  No neuro deficits noted.

## 2020-08-06 NOTE — ED Provider Notes (Signed)
[facility] EMERGENCY DEPARTMENT Provider Note   CSN: [id number] Arrival date & time: 08/06/20  1216     History Chief Complaint  Patient presents with  . Loss of Consciousness    Timothy Mack is a 30 y.o. male.  The history is provided by the patient.  Loss of Consciousness Episode history:  Single Most recent episode:  Today Duration:  2 minutes Timing:  Rare Progression:  Resolved Chronicity:  New Context: dehydration and standing up   Witnessed: no   Relieved by:  Nothing Worsened by:  Nothing Ineffective treatments:  None tried Associated symptoms: diaphoresis   Risk factors: no congenital heart disease and no coronary artery disease        Past Medical History:  Diagnosis Date  . Allergies     There are no problems to display for this patient.   History reviewed. No pertinent surgical history.     Family History  Problem Relation Age of Onset  . Diabetes Other   . Hypertension Other     Social History   Tobacco Use  . Smoking status: Never Smoker  . Smokeless tobacco: Never Used  Substance Use Topics  . Alcohol use: No  . Drug use: No    Home Medications Prior to Admission medications   Medication Sig Start Date End Date Taking? Authorizing Provider  cetirizine (ZYRTEC) 10 MG tablet Take 10 mg by mouth daily.   Yes [provider]  fluticasone (FLONASE) 50 MCG/ACT nasal spray Place 2 sprays into both nostrils daily.   Yes [provider]    Allergies    Patient has no known allergies.  Review of Systems   Review of Systems  Constitutional: Positive for diaphoresis.  Cardiovascular: Positive for syncope.    Physical Exam Updated Vital Signs BP 116/81   Pulse 79   Temp 98.8 F (37.1 C)   Resp 15   SpO2 99%   Physical Exam Vitals and nursing note reviewed.  Constitutional:      Appearance: He is well-developed and well-nourished.  HENT:     Head: Normocephalic and atraumatic.   Eyes:     Conjunctiva/sclera: Conjunctivae normal.  Neck:     Comments: Mild right reproducible trapezius tenderness with pain worsened with looking to the left with the extremes Cardiovascular:     Rate and Rhythm: Normal rate and regular rhythm.     Heart sounds: No murmur heard.   Pulmonary:     Effort: Pulmonary effort is normal. No respiratory distress.     Breath sounds: Normal breath sounds.  Abdominal:     Palpations: Abdomen is soft.     Tenderness: There is no abdominal tenderness.  Musculoskeletal:        General: No edema.     Cervical back: Neck supple.  Skin:    General: Skin is warm and dry.  Neurological:     Mental Status: He is alert.     GCS: GCS eye subscore is 4. GCS verbal subscore is 5. GCS motor subscore is 6.     Cranial Nerves: Cranial nerves are intact.     Sensory: Sensation is intact.     Motor: Motor function is intact. No weakness or abnormal muscle tone.     Coordination: Romberg sign negative. Finger-Nose-Finger Test and Heel to [place] Test normal.     Gait: Gait is intact. Gait normal.  Psychiatric:        Mood and Affect: Mood and affect normal.  ED Results / Procedures / Treatments   Labs (all labs ordered are listed, but only abnormal results are displayed) Labs Reviewed  BASIC METABOLIC PANEL - Abnormal; Notable for the following components:      Result Value   Glucose, Bld 105 (*)    All other components within normal limits  CBC  URINALYSIS, ROUTINE W REFLEX MICROSCOPIC  CBG MONITORING, ED  TROPONIN I (HIGH SENSITIVITY)    EKG EKG Interpretation  Date/Time:  Sunday August 06 2020 12:20:50 EST Ventricular Rate:  80 PR Interval:  152 QRS Duration: 90 QT Interval:  362 QTC Calculation: 417 R Axis:   74 Text Interpretation: Normal sinus rhythm ST elevation, consider early repolarization Borderline ECG no stemi Confirmed by Alona Bene [phone]) on 08/06/2020 4:42:28 PM   Radiology DG Chest Portable 1 View  Result  Date: 08/06/2020 CLINICAL DATA:  Syncope, recent COVID. EXAM: PORTABLE CHEST 1 VIEW COMPARISON:  November 01, 2017. FINDINGS: The heart size and mediastinal contours are within normal limits. Both lungs are clear. No visible pleural effusions or pneumothorax. No acute osseous abnormality. IMPRESSION: No active disease. Electronically Signed   By: Feliberto Harts MD   On: 08/06/2020 17:07    Procedures Procedures (including critical care time)  Medications Ordered in ED Medications  lactated ringers bolus 1,000 mL (1,000 mLs Intravenous New Bag/Given 08/06/20 1810)    ED Course  I have reviewed the triage vital signs and the nursing notes.  Pertinent labs & imaging results that were available during my care of the patient were reviewed by me and considered in my medical decision making (see chart for details).    MDM Rules/Calculators/A&P                          This is a 30 year old male with a remote history of COVID-19, diagnosed on July 17, 2020, who presents to the emergency department for evaluation of syncope.  Patient reports he awoke around 11 AM, went into the restroom to brush his teeth, felt a little lightheaded, walked into his living room where he had a syncopal episode, woke up 2 minutes later (based off text that he has on his phone) on the floor.  He has no evidence of trauma and denies any pain or injuries from his syncopal episode.  He states that currently he feels back at baseline, however while out in the waiting room he did have an episode where he felt diaphoretic.  On exam he is afebrile, hemodynamically stable, no acute distress.  Heart and lungs are normal.  His neurologic exam is within normal limits, ambulates with steady gait, heel-to-toe walking is normal, Romberg negative, finger-nose testing negative.  EKG reviewed does show some nonspecific ST changes in the inferior leads with she is likely related to early repull, however slightly more prominent than  previous.  Given his syncopal episode, recent COVID-19 diagnosis, will evaluate for underlying cardiac condition with troponin, telemetry monitoring.  His mucous membranes are slightly dry, he states that he may have not been drinking enough water recently, will give 1 L of LR.  He does report that he has had some right-sided neck pain and stiffness, believes that he slept on his neck wrong a few days ago, he noticed this yesterday, he has no radicular symptoms, denies headache, denies dizziness at rest or when standing up currently.  His neurologic exam is normal, given this pain is reproducible with looking to the left and he  has no neurologic deficits, doubt other etiology of neck pain such as vertebral dissection, infectious sources.  He has no focal weakness or ongoing symptoms to make me concerned for acute CVA, low risk factors for TIA or other intracranial pathology.  His troponin is within normal limits, again his EKG has some nonspecific ST changes, but he is having no chest pain or shortness of breath, low suspicion for ACS.  Patient remained asymptomatic, at baseline, was reevaluated with an unchanged normal neurologic exam.  Patient discharged home to follow-up with primary care doctor or return if symptoms recur.  Final Clinical Impression(s) / ED Diagnoses Final diagnoses:  Syncope, unspecified syncope type    Rx / DC Orders ED Discharge Orders    None       Kathleen Lime, MD 08/06/20 Otilio Miu, MD 08/06/20 2146

## 2020-08-06 NOTE — ED Notes (Signed)
The pt is c/o no pain he rep[orts that he feels pretty good a and o x 4

## 2020-08-06 NOTE — Discharge Instructions (Signed)
Please attempt to remain hydrated, drink plenty of fluids.  If you develop any further dizziness, lightheadedness, or syncopal events, please return the emergency department.  Recommend you see your primary care doctor in the next 3 to 5 days to discuss possible further work-up.

## 2021-01-26 ENCOUNTER — Encounter (HOSPITAL_BASED_OUTPATIENT_CLINIC_OR_DEPARTMENT_OTHER): Payer: Self-pay | Admitting: *Deleted

## 2021-01-26 ENCOUNTER — Other Ambulatory Visit (HOSPITAL_BASED_OUTPATIENT_CLINIC_OR_DEPARTMENT_OTHER): Payer: Self-pay

## 2021-01-26 ENCOUNTER — Emergency Department (HOSPITAL_BASED_OUTPATIENT_CLINIC_OR_DEPARTMENT_OTHER)
Admission: EM | Admit: 2021-01-26 | Discharge: 2021-01-26 | Disposition: A | Discharge status: Home / Self Care | Payer: BC Managed Care – PPO | Attending: Emergency Medicine | Admitting: Emergency Medicine

## 2021-01-26 ENCOUNTER — Other Ambulatory Visit: Payer: Self-pay

## 2021-01-26 ENCOUNTER — Emergency Department (HOSPITAL_BASED_OUTPATIENT_CLINIC_OR_DEPARTMENT_OTHER): Payer: BC Managed Care – PPO

## 2021-01-26 DIAGNOSIS — Y92410 Unspecified street and highway as the place of occurrence of the external cause: Secondary | ICD-10-CM | POA: Insufficient documentation

## 2021-01-26 DIAGNOSIS — S161XXA Strain of muscle, fascia and tendon at neck level, initial encounter: Secondary | ICD-10-CM | POA: Insufficient documentation

## 2021-01-26 DIAGNOSIS — R519 Headache, unspecified: Secondary | ICD-10-CM | POA: Diagnosis not present

## 2021-01-26 DIAGNOSIS — S199XXA Unspecified injury of neck, initial encounter: Secondary | ICD-10-CM | POA: Diagnosis present

## 2021-01-26 MED ORDER — CYCLOBENZAPRINE HCL 10 MG PO TABS
10.0000 mg | ORAL_TABLET | Freq: Two times a day (BID) | ORAL | 0 refills | Status: AC | PRN
  Filled 2021-01-26: qty 14, 7d supply, fill #0

## 2021-01-26 MED ORDER — IBUPROFEN 600 MG PO TABS
600.0000 mg | ORAL_TABLET | Freq: Four times a day (QID) | ORAL | 0 refills | Status: AC | PRN
  Filled 2021-01-26: qty 30, 8d supply, fill #0
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
## 2021-02-01 ENCOUNTER — Other Ambulatory Visit: Payer: Self-pay

## 2021-02-01 ENCOUNTER — Emergency Department (HOSPITAL_BASED_OUTPATIENT_CLINIC_OR_DEPARTMENT_OTHER)
Admission: EM | Admit: 2021-02-01 | Discharge: 2021-02-01 | Disposition: A | Discharge status: Home / Self Care | Payer: BC Managed Care – PPO | Attending: Emergency Medicine | Admitting: Emergency Medicine

## 2021-02-01 ENCOUNTER — Encounter (HOSPITAL_BASED_OUTPATIENT_CLINIC_OR_DEPARTMENT_OTHER): Payer: Self-pay | Admitting: *Deleted

## 2021-02-01 DIAGNOSIS — R0981 Nasal congestion: Secondary | ICD-10-CM | POA: Diagnosis not present

## 2021-02-01 DIAGNOSIS — Z20822 Contact with and (suspected) exposure to covid-19: Secondary | ICD-10-CM | POA: Diagnosis not present

## 2021-02-01 DIAGNOSIS — R42 Dizziness and giddiness: Secondary | ICD-10-CM

## 2021-02-01 LAB — RESP PANEL BY RT-PCR (FLU A&B, COVID) ARPGX2
Influenza A by PCR: NEGATIVE
Influenza B by PCR: NEGATIVE
SARS Coronavirus 2 by RT PCR: NEGATIVE

## 2021-02-01 MED ORDER — LORATADINE 10 MG PO TABS: 10.0000 mg | ORAL_TABLET | Freq: Every day | ORAL | 0 refills | Status: AC

## 2021-02-01 MED ORDER — ACETAMINOPHEN 500 MG PO TABS
1000.0000 mg | ORAL_TABLET | Freq: Once | ORAL | Status: AC
  Administered 2021-02-01: 1000 mg via ORAL
  Filled 2021-02-01: qty 2

## 2021-02-01 MED ORDER — LORATADINE 10 MG PO TABS
10.0000 mg | ORAL_TABLET | Freq: Once | ORAL | Status: AC
  Administered 2021-02-01: 10 mg via ORAL
  Filled 2021-02-01: qty 1

## 2021-02-01 MED ORDER — FLUTICASONE PROPIONATE 50 MCG/ACT NA SUSP: 2.0000 | Freq: Every day | NASAL | 2 refills | Status: AC
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
## 2021-02-14 ENCOUNTER — Emergency Department (HOSPITAL_BASED_OUTPATIENT_CLINIC_OR_DEPARTMENT_OTHER)
Admission: EM | Admit: 2021-02-14 | Discharge: 2021-02-15 | Disposition: A | Discharge status: Home / Self Care | Payer: BC Managed Care – PPO | Attending: Emergency Medicine | Admitting: Emergency Medicine

## 2021-02-14 ENCOUNTER — Encounter (HOSPITAL_BASED_OUTPATIENT_CLINIC_OR_DEPARTMENT_OTHER): Payer: Self-pay | Admitting: *Deleted

## 2021-02-14 ENCOUNTER — Other Ambulatory Visit: Payer: Self-pay

## 2021-02-14 DIAGNOSIS — W228XXA Striking against or struck by other objects, initial encounter: Secondary | ICD-10-CM | POA: Diagnosis not present

## 2021-02-14 DIAGNOSIS — Y93G1 Activity, food preparation and clean up: Secondary | ICD-10-CM | POA: Diagnosis not present

## 2021-02-14 DIAGNOSIS — S0990XA Unspecified injury of head, initial encounter: Secondary | ICD-10-CM | POA: Diagnosis present

## 2021-02-14 NOTE — ED Triage Notes (Signed)
C/o head injury x 4 hrs ago

## 2021-02-15 NOTE — ED Notes (Signed)
Struck head on cabinet door when he raised up from putting dishes in the dishwasher.

## 2021-02-15 NOTE — ED Provider Notes (Signed)
[facility] EMERGENCY DEPARTMENT Provider Note   CSN: [id number] Arrival date & time: 02/14/21  2351     History Chief Complaint  Patient presents with   Head Injury    Timothy Mack is a 30 y.o. male.  30 yo M with a chief complaint of a closed head injury.  The patient states that he was putting dishes away and he stood up suddenly and smacked his head on the open counter door.  Denies loss of consciousness denies confusion denies vomiting.  Is electively taking a baby aspirin every day otherwise denies blood thinners.  Denies one-sided numbness or weakness denies difficulty speech or swallowing.  The history is provided by the patient.  Head Injury Head/neck injury location: vertex. Time since incident:  4 hours Mechanism of injury: direct blow   Pain details:    Quality:  Aching   Severity:  Mild   Duration:  4 hours   Timing:  Constant   Progression:  Unchanged Chronicity:  New Relieved by:  Nothing Worsened by:  Nothing Ineffective treatments:  None tried Associated symptoms: headache and nausea   Associated symptoms: no vomiting       Past Medical History:  Diagnosis Date   Allergies     There are no problems to display for this patient.   History reviewed. No pertinent surgical history.     Family History  Problem Relation Age of Onset   Diabetes Other    Hypertension Other     Social History   Tobacco Use   Smoking status: Never   Smokeless tobacco: Never  Substance Use Topics   Alcohol use: No   Drug use: No    Home Medications Prior to Admission medications   Medication Sig Start Date End Date Taking? Authorizing Provider  cetirizine (ZYRTEC) 10 MG tablet Take 10 mg by mouth daily.    [provider]  fluticasone (FLONASE) 50 MCG/ACT nasal spray Place 2 sprays into both nostrils daily.    [provider]  fluticasone (FLONASE) 50 MCG/ACT nasal spray Place 2 sprays into both nostrils daily. 02/01/21    Arby Barrette, MD  ibuprofen (ADVIL) 600 MG tablet Take 1 tablet (600 mg total) by mouth every 6 (six) hours as needed for moderate pain or mild pain. 01/26/21   Terald Sleeper, MD  loratadine (CLARITIN) 10 MG tablet Take 1 tablet (10 mg total) by mouth daily. 02/01/21   Arby Barrette, MD    Allergies    Patient has no known allergies.  Review of Systems   Review of Systems  Constitutional:  Negative for chills and fever.  HENT:  Negative for congestion and facial swelling.   Eyes:  Negative for discharge and visual disturbance.  Respiratory:  Negative for shortness of breath.   Cardiovascular:  Negative for chest pain and palpitations.  Gastrointestinal:  Positive for nausea. Negative for abdominal pain, diarrhea and vomiting.  Musculoskeletal:  Negative for arthralgias and myalgias.  Skin:  Negative for color change and rash.  Neurological:  Positive for headaches. Negative for tremors and syncope.  Psychiatric/Behavioral:  Negative for confusion and dysphoric mood.    Physical Exam Updated Vital Signs BP (!) 136/102 (BP Location: Left Arm)   Pulse 88   Temp 98.8 F (37.1 C) (Oral)   Resp 18   Ht 5\' 11"  (1.803 m)   Wt 74.4 kg   SpO2 100%   BMI 22.87 kg/m   Physical Exam Vitals and nursing note reviewed.  Constitutional:      Appearance: He is well-developed.  HENT:     Head: Normocephalic and atraumatic.  Eyes:     Pupils: Pupils are equal, round, and reactive to light.  Neck:     Vascular: No JVD.  Cardiovascular:     Rate and Rhythm: Normal rate and regular rhythm.     Heart sounds: No murmur heard.   No friction rub. No gallop.  Pulmonary:     Effort: No respiratory distress.     Breath sounds: No wheezing.  Abdominal:     General: There is no distension.     Tenderness: There is no abdominal tenderness. There is no guarding or rebound.  Musculoskeletal:        General: Normal range of motion.     Cervical back: Normal range of motion and neck supple.   Skin:    Coloration: Skin is not pale.     Findings: No rash.  Neurological:     Mental Status: He is alert and oriented to person, place, and time.     GCS: GCS eye subscore is 4. GCS verbal subscore is 5. GCS motor subscore is 6.     Cranial Nerves: Cranial nerves are intact.     Sensory: Sensation is intact.     Motor: Motor function is intact.     Coordination: Coordination is intact.     Gait: Gait is intact.     Comments: Benign neurologic exam  Psychiatric:        Behavior: Behavior normal.    ED Results / Procedures / Treatments   Labs (all labs ordered are listed, but only abnormal results are displayed) Labs Reviewed - No data to display  EKG None  Radiology No results found.  Procedures Procedures   Medications Ordered in ED Medications - No data to display  ED Course  I have reviewed the triage vital signs and the nursing notes.  Pertinent labs & imaging results that were available during my care of the patient were reviewed by me and considered in my medical decision making (see chart for details).    MDM Rules/Calculators/A&P                           30 yo M with a chief complaints of a closed head injury.  Very minimal mechanism.  Having some mild nausea.  He is on a baby aspirin every day but I feel like his likelihood of intracranial hemorrhage is extremely low.  I discussed return precautions with him.  I did discuss with him the most recent task force guidelines that suggest that aspirin is not beneficial prophylactically without having a stroke or an MI in the past.  States understanding will talk with his family doctor about this.  12:58 AM:  I have discussed the diagnosis/risks/treatment options with the patient and believe the pt to be eligible for discharge home to follow-up with PCP. We also discussed returning to the ED immediately if new or worsening sx occur. We discussed the sx which are most concerning (e.g., sudden worsening pain, fever,  inability to tolerate by mouth, confusion, vomiting) that necessitate immediate return. Medications administered to the patient during their visit and any new prescriptions provided to the patient are listed below.  Medications given during this visit Medications - No data to display   The patient appears reasonably screen and/or stabilized for discharge and I doubt any other medical condition or  other EMC requiring further screening, evaluation, or treatment in the ED at this time prior to discharge.   Final Clinical Impression(s) / ED Diagnoses Final diagnoses:  Closed head injury, initial encounter    Rx / DC Orders ED Discharge Orders     None        Melene Plan, DO 02/15/21 3154

## 2021-02-15 NOTE — Discharge Instructions (Addendum)
Take tylenol or motrin for pain.    Please return for persistent vomiting or confusion.

## 2021-02-25 ENCOUNTER — Other Ambulatory Visit: Payer: Self-pay

## 2021-02-25 ENCOUNTER — Emergency Department (HOSPITAL_BASED_OUTPATIENT_CLINIC_OR_DEPARTMENT_OTHER): Payer: BC Managed Care – PPO

## 2021-02-25 ENCOUNTER — Encounter (HOSPITAL_BASED_OUTPATIENT_CLINIC_OR_DEPARTMENT_OTHER): Payer: Self-pay | Admitting: *Deleted

## 2021-02-25 ENCOUNTER — Emergency Department (HOSPITAL_BASED_OUTPATIENT_CLINIC_OR_DEPARTMENT_OTHER)
Admission: EM | Admit: 2021-02-25 | Discharge: 2021-02-25 | Disposition: A | Discharge status: Home / Self Care | Payer: BC Managed Care – PPO | Attending: Emergency Medicine | Admitting: Emergency Medicine

## 2021-02-25 DIAGNOSIS — R079 Chest pain, unspecified: Secondary | ICD-10-CM | POA: Diagnosis not present

## 2021-02-25 DIAGNOSIS — M79602 Pain in left arm: Secondary | ICD-10-CM | POA: Diagnosis not present

## 2021-02-25 LAB — BASIC METABOLIC PANEL
Anion gap: 9 (ref 5–15)
BUN: 9 mg/dL (ref 6–20)
CO2: 27 mmol/L (ref 22–32)
Calcium: 9 mg/dL (ref 8.9–10.3)
Chloride: 102 mmol/L (ref 98–111)
Creatinine, Ser: 0.93 mg/dL (ref 0.61–1.24)
GFR, Estimated: >60 mL/min (ref >60)
Glucose, Bld: 94 mg/dL (ref 70–99)
Potassium: 3.8 mmol/L (ref 3.5–5.1)
Sodium: 138 mmol/L (ref 135–145)

## 2021-02-25 LAB — TROPONIN I (HIGH SENSITIVITY): Troponin I (High Sensitivity): <2 ng/L (ref <18)

## 2021-02-25 LAB — CBC
HCT: 46.1 % (ref 39.0–52.0)
Hemoglobin: 16.2 g/dL (ref 13.0–17.0)
MCH: 30.7 pg (ref 26.0–34.0)
MCHC: 35.1 g/dL (ref 30.0–36.0)
MCV: 87.5 fL (ref 80.0–100.0)
Platelets: 168 10*3/uL (ref 150–400)
RBC: 5.27 MIL/uL (ref 4.22–5.81)
RDW: 11.9 % (ref 11.5–15.5)
WBC: 4.8 10*3/uL (ref 4.0–10.5)
nRBC: 0 % (ref 0.0–0.2)

## 2021-02-25 NOTE — ED Provider Notes (Signed)
[facility] EMERGENCY DEPARTMENT Provider Note   CSN: [id number] Arrival date & time: 02/25/21  1515     History Chief Complaint  Patient presents with   Chest Pain    Timothy Mack is a 30 y.o. male.  30 year old male presents with complaint of pain in the left side of his chest which occurred around 6 AM today when he was laying in bed.  Pain is described as a pulling sensation, he was laying on his left side at that time, pain resolved when he rolled onto his right side.  Has an aching pain in his left biceps area otherwise denies shortness of breath, nausea, vomiting, diaphoresis.  Is not having any exertional pain.  No cardiac history, no family cardiac history.  Denies history of hypertension, hyperlipidemia, diabetes, is a non-smoker.      Past Medical History:  Diagnosis Date   Allergies     There are no problems to display for this patient.   History reviewed. No pertinent surgical history.     Family History  Problem Relation Age of Onset   Diabetes Other    Hypertension Other     Social History   Tobacco Use   Smoking status: Never   Smokeless tobacco: Never  Substance Use Topics   Alcohol use: No   Drug use: No    Home Medications Prior to Admission medications   Medication Sig Start Date End Date Taking? Authorizing Provider  cetirizine (ZYRTEC) 10 MG tablet Take 10 mg by mouth daily.    [provider]  fluticasone (FLONASE) 50 MCG/ACT nasal spray Place 2 sprays into both nostrils daily.    [provider]  fluticasone (FLONASE) 50 MCG/ACT nasal spray Place 2 sprays into both nostrils daily. 02/01/21   Arby Barrette, MD  ibuprofen (ADVIL) 600 MG tablet Take 1 tablet (600 mg total) by mouth every 6 (six) hours as needed for moderate pain or mild pain. 01/26/21   Terald Sleeper, MD  loratadine (CLARITIN) 10 MG tablet Take 1 tablet (10 mg total) by mouth daily. 02/01/21   Arby Barrette, MD    Allergies    Patient  has no known allergies.  Review of Systems   Review of Systems  Constitutional:  Negative for chills, diaphoresis and fever.  Respiratory:  Negative for shortness of breath.   Cardiovascular:  Positive for chest pain. Negative for palpitations and leg swelling.  Gastrointestinal:  Negative for abdominal pain, nausea and vomiting.  Musculoskeletal:  Positive for myalgias. Negative for arthralgias, back pain and neck pain.  Skin:  Negative for rash and wound.  Allergic/Immunologic: Negative for immunocompromised state.  Neurological:  Negative for weakness and numbness.  Hematological:  Negative for adenopathy.  Psychiatric/Behavioral:  Negative for confusion.   All other systems reviewed and are negative.  Physical Exam Updated Vital Signs BP (!) 149/94 (BP Location: Right Arm)   Pulse 82   Temp 98.6 F (37 C) (Oral)   Resp 18   Ht 5\' 11"  (1.803 m)   Wt 72.6 kg   SpO2 100%   BMI 22.32 kg/m   Physical Exam Vitals and nursing note reviewed.  Constitutional:      General: He is not in acute distress.    Appearance: He is well-developed. He is not diaphoretic.  HENT:     Head: Normocephalic and atraumatic.  Cardiovascular:     Rate and Rhythm: Normal rate and regular rhythm.     Heart sounds: Normal heart sounds.  No murmur heard. Pulmonary:     Effort: Pulmonary effort is normal.     Breath sounds: Normal breath sounds.  Chest:     Chest wall: No tenderness.  Abdominal:     Palpations: Abdomen is soft.  Musculoskeletal:     Cervical back: Neck supple.     Right lower leg: No tenderness. No edema.     Left lower leg: No tenderness. No edema.     Comments: Mild tenderness with palpation of the left biceps.  Skin normal.  Strong radial pulses.  Skin:    General: Skin is warm and dry.     Findings: No erythema or rash.  Neurological:     Mental Status: He is alert and oriented to person, place, and time.  Psychiatric:        Behavior: Behavior normal.    ED Results  / Procedures / Treatments   Labs (all labs ordered are listed, but only abnormal results are displayed) Labs Reviewed  BASIC METABOLIC PANEL  CBC  TROPONIN I (HIGH SENSITIVITY)  TROPONIN I (HIGH SENSITIVITY)    EKG None  Radiology DG Chest 2 View  Result Date: 02/25/2021 CLINICAL DATA:  Chest pain since 06 a.m. EXAM: CHEST - 2 VIEW COMPARISON:  08/06/2020 and earlier FINDINGS: The heart size and mediastinal contours are within normal limits. Both lungs are clear. The visualized skeletal structures are unremarkable. IMPRESSION: No active cardiopulmonary disease. Electronically Signed   By: Norva Pavlov M.D.   On: 02/25/2021 16:19    Procedures Procedures   Medications Ordered in ED Medications - No data to display  ED Course  I have reviewed the triage vital signs and the nursing notes.  Pertinent labs & imaging results that were available during my care of the patient were reviewed by me and considered in my medical decision making (see chart for details).  Clinical Course as of 02/25/21 1719  Sun Feb 25, 2021  [id number] 30 year old male presents with complaint of left-sided chest pain with left arm pain earlier onset today.  On exam has tenderness of the left bicep area, no chest wall tenderness, not having chest pain at this time.  EKG without significant changes from prior.  Labs reassuring including CBC, BMP within normal limits, troponin less than 2.  Chest x-ray is unremarkable. Offered reassurance, recommend patient recheck with his primary care provider. [LM]    Clinical Course User Index [LM] Alden Hipp   MDM Rules/Calculators/A&P                           Final Clinical Impression(s) / ED Diagnoses Final diagnoses:  Chest pain, unspecified type    Rx / DC Orders ED Discharge Orders     None        Jeannie Fend, PA-C 02/25/21 1719    Arby Barrette, MD 03/25/21 1434

## 2021-02-25 NOTE — ED Triage Notes (Signed)
Presents with chest pain. Onset at approx 0600hrs, awoke with chest pain, states began at left ant chest and radiated to left arm, felt like a "pulling sensation", denies diaphoresis, shortness of breath or even n/v.

## 2021-02-25 NOTE — ED Notes (Signed)
Staff @bedside  for labs.

## 2021-02-25 NOTE — Discharge Instructions (Addendum)
Follow-up with your primary care provider.  Your work-up today is reassuring.

## 2021-04-25 ENCOUNTER — Other Ambulatory Visit: Payer: Self-pay

## 2021-04-25 ENCOUNTER — Emergency Department (HOSPITAL_BASED_OUTPATIENT_CLINIC_OR_DEPARTMENT_OTHER)
Admission: EM | Admit: 2021-04-25 | Discharge: 2021-04-25 | Disposition: A | Discharge status: Home / Self Care | Payer: BC Managed Care – PPO | Attending: Emergency Medicine | Admitting: Emergency Medicine

## 2021-04-25 ENCOUNTER — Emergency Department (HOSPITAL_BASED_OUTPATIENT_CLINIC_OR_DEPARTMENT_OTHER): Payer: BC Managed Care – PPO

## 2021-04-25 ENCOUNTER — Encounter (HOSPITAL_BASED_OUTPATIENT_CLINIC_OR_DEPARTMENT_OTHER): Payer: Self-pay | Admitting: *Deleted

## 2021-04-25 DIAGNOSIS — R072 Precordial pain: Secondary | ICD-10-CM | POA: Diagnosis not present

## 2021-04-25 DIAGNOSIS — Z8616 Personal history of COVID-19: Secondary | ICD-10-CM | POA: Diagnosis not present

## 2021-04-25 DIAGNOSIS — R5383 Other fatigue: Secondary | ICD-10-CM | POA: Insufficient documentation

## 2021-04-25 NOTE — ED Provider Notes (Signed)
[facility] EMERGENCY DEPARTMENT Provider Note   CSN: [id number] Arrival date & time: 04/25/21  0035     History Chief Complaint  Patient presents with   Chest Pain    Timothy Mack is a 30 y.o. male.  The history is provided by the patient.  Chest Pain Pain location:  L chest and R chest Pain quality comment:  Soreness Pain severity:  Moderate Onset quality:  Gradual Duration:  4 days Timing:  Constant Progression:  Improving Chronicity:  New Relieved by: APAP. Worsened by:  Certain positions and movement Associated symptoms: no abdominal pain, no diaphoresis, no fever, no shortness of breath, no syncope, no vomiting and no weakness   Risk factors: no diabetes mellitus, no hypertension, no prior DVT/PE and no smoking   Patient reports that approximately 4 days ago he was cleaning.  He reports he was vacuuming and dusting.  While cleaning, he started noticing soreness in his right and left chest.  He reports he stopped, took a Tylenol and felt improved. He had no other associated symptoms.  He denies shortness of breath or diaphoresis.  No pleuritic pain is reported He reports the pain is continued but is improving.  It seems worse with movement and palpation. He is otherwise able to do his daily activities.   He reports mild fatigue since a bout of COVID-19 last month Past Medical History:  Diagnosis Date   Allergies          Family History  Problem Relation Age of Onset   Diabetes Other    Hypertension Other     Social History   Tobacco Use   Smoking status: Never   Smokeless tobacco: Never  Substance Use Topics   Alcohol use: No   Drug use: No    Home Medications Prior to Admission medications   Medication Sig Start Date End Date Taking? Authorizing Provider  cetirizine (ZYRTEC) 10 MG tablet Take 10 mg by mouth daily.    [provider]  fluticasone (FLONASE) 50 MCG/ACT nasal spray Place 2 sprays into both nostrils daily.     [provider]  fluticasone (FLONASE) 50 MCG/ACT nasal spray Place 2 sprays into both nostrils daily. 02/01/21   Arby Barrette, MD  ibuprofen (ADVIL) 600 MG tablet Take 1 tablet (600 mg total) by mouth every 6 (six) hours as needed for moderate pain or mild pain. 01/26/21   Terald Sleeper, MD  loratadine (CLARITIN) 10 MG tablet Take 1 tablet (10 mg total) by mouth daily. 02/01/21   Arby Barrette, MD    Allergies    Patient has no known allergies.  Review of Systems   Review of Systems  Constitutional:  Negative for diaphoresis and fever.  Respiratory:  Negative for shortness of breath.   Cardiovascular:  Positive for chest pain. Negative for syncope.  Gastrointestinal:  Negative for abdominal pain and vomiting.  Neurological:  Negative for weakness.   Physical Exam Updated Vital Signs BP 120/75 (BP Location: Left Arm)   Pulse 80   Temp 98.6 F (37 C) (Oral)   Resp 16   Ht 1.803 m (5\' 11" )   Wt 74.8 kg   SpO2 98%   BMI 23.01 kg/m   Physical Exam CONSTITUTIONAL: Well developed/well nourished HEAD: Normocephalic/atraumatic EYES: EOMI ENMT: Mask and shield in place NECK: supple no meningeal signs SPINE/BACK:entire spine nontender CV: S1/S2 noted, no murmurs/rubs/gallops noted Chest-substernal chest wall tenderness.  Pain is also reproduced with movement of his upper body LUNGS:  Lungs are clear to auscultation bilaterally, no apparent distress ABDOMEN: soft NEURO: Pt is awake/alert/appropriate, moves all extremitiesx4.  No facial droop.   EXTREMITIES: pulses normal/equal, full ROM, no obvious edema in his lower extremities SKIN: warm, color normal PSYCH: no abnormalities of mood noted, alert and oriented to situation  ED Results / Procedures / Treatments   Labs (all labs ordered are listed, but only abnormal results are displayed) Labs Reviewed - No data to display  EKG EKG Interpretation  Date/Time:  Wednesday April 25 2021 00:46:42 EDT Ventricular  Rate:  92 PR Interval:  146 QRS Duration: 88 QT Interval:  342 QTC Calculation: 422 R Axis:   88 Text Interpretation: Normal sinus rhythm Normal ECG No significant change since last tracing Confirmed by Zadie Rhine ([id number]) on 04/25/2021 12:50:12 AM  Radiology DG Chest 2 View  Result Date: 04/25/2021 CLINICAL DATA:  Right-sided chest pain. EXAM: CHEST - 2 VIEW COMPARISON:  February 25, 2021 FINDINGS: The heart size and mediastinal contours are within normal limits. Both lungs are clear. The visualized skeletal structures are unremarkable. IMPRESSION: No active cardiopulmonary disease. Electronically Signed   By: Aram Candela M.D.   On: 04/25/2021 03:10    Procedures Procedures   Medications Ordered in ED Medications - No data to display  ED Course  I have reviewed the triage vital signs and the nursing notes.  Pertinent imaging results that were available during my care of the patient were reviewed by me and considered in my medical decision making (see chart for details).    MDM Rules/Calculators/A&P                           Patient presents with chest pain for 4 days that is improving.  It appears that it has been nearly continuous but worse with movement and palpation.  He has no other associated symptoms.  His vitals are overall unremarkable. Patient admits that he may have been overly aggressive with cleaning and strained his chest wall. He has not had any increased fatigue/shortness of breath with daily exertion I have very low suspicion for ACS/PE/dissection at this time. We discussed strict ER return precautions.  No further work-up required at this time Final Clinical Impression(s) / ED Diagnoses Final diagnoses:  Precordial pain    Rx / DC Orders ED Discharge Orders     None        Zadie Rhine, MD 04/25/21 [phone]

## 2021-04-25 NOTE — ED Triage Notes (Signed)
Pt. Reports he has had chest pain since Sat.   Pt. Reports the chest pain started shortly after he was cleaning.  Pt. Describes the chest pain as pressure.  Pain is a 3/10 per Pt.  No sweating no nausea No vomiting per pt.

## 2021-04-25 NOTE — ED Notes (Signed)
Pt c/o right side chest pain that started x 4 days ago while cleaning; pt states the pain goes all across his chest; pt has no other complaints

## 2021-04-25 NOTE — Discharge Instructions (Addendum)

## 2021-07-27 ENCOUNTER — Other Ambulatory Visit: Payer: Self-pay

## 2021-07-27 ENCOUNTER — Encounter (HOSPITAL_BASED_OUTPATIENT_CLINIC_OR_DEPARTMENT_OTHER): Payer: Self-pay | Admitting: Emergency Medicine

## 2021-07-27 ENCOUNTER — Emergency Department (HOSPITAL_BASED_OUTPATIENT_CLINIC_OR_DEPARTMENT_OTHER)
Admission: EM | Admit: 2021-07-27 | Discharge: 2021-07-27 | Disposition: A | Discharge status: Home / Self Care | Payer: BC Managed Care – PPO | Attending: Emergency Medicine | Admitting: Emergency Medicine

## 2021-07-27 DIAGNOSIS — X58XXXA Exposure to other specified factors, initial encounter: Secondary | ICD-10-CM | POA: Diagnosis not present

## 2021-07-27 DIAGNOSIS — S0990XA Unspecified injury of head, initial encounter: Secondary | ICD-10-CM | POA: Diagnosis present

## 2021-07-27 DIAGNOSIS — S0003XA Contusion of scalp, initial encounter: Secondary | ICD-10-CM | POA: Insufficient documentation

## 2021-07-27 NOTE — Discharge Instructions (Signed)
Apply ice for 30 minutes at a time, 4 times a day, as needed.  Take ibuprofen or acetaminophen as needed for pain.  Return if you have any new or concerning symptoms.

## 2021-07-27 NOTE — ED Triage Notes (Signed)
Pt states hit side of left head on wall at. Denies LOC 2 hours ago.

## 2021-07-27 NOTE — ED Provider Notes (Signed)
°  [facility] [place] EMERGENCY DEPARTMENT Provider Note   CSN: [id number] Arrival date & time: 07/27/21  0026     History  Chief Complaint  Patient presents with   Head Injury    Timothy Mack is a 31 y.o. male.  The history is provided by the patient.  Head Injury     Home Medications Prior to Admission medications   Medication Sig Start Date End Date Taking? Authorizing Provider  cetirizine (ZYRTEC) 10 MG tablet Take 10 mg by mouth daily.    [provider]  fluticasone (FLONASE) 50 MCG/ACT nasal spray Place 2 sprays into both nostrils daily.    [provider]  fluticasone (FLONASE) 50 MCG/ACT nasal spray Place 2 sprays into both nostrils daily. 02/01/21   Charlesetta Shanks, MD  ibuprofen (ADVIL) 600 MG tablet Take 1 tablet (600 mg total) by mouth every 6 (six) hours as needed for moderate pain or mild pain. 01/26/21   Wyvonnia Dusky, MD  loratadine (CLARITIN) 10 MG tablet Take 1 tablet (10 mg total) by mouth daily. 02/01/21   Charlesetta Shanks, MD      Allergies    Patient has no known allergies.    Review of Systems   Review of Systems  All other systems reviewed and are negative.  Physical Exam Updated Vital Signs BP (!) 141/99 (BP Location: Left Arm)    Pulse 78    Temp 98.1 F (36.7 C) (Oral)    Resp 18    Ht 5\' 10"  (1.778 m)    Wt 78.9 kg    SpO2 100%    BMI 24.97 kg/m  Physical Exam Vitals and nursing note reviewed.  31 year old male, resting comfortably and in no acute distress. Vital signs are significant for mildly elevated blood pressure. Oxygen saturation is 100%, which is normal. Head is normocephalic and atraumatic. PERRLA, EOMI. Oropharynx is clear. Neck is nontender and supple without adenopathy. Back is nontender and there is no CVA tenderness. Lungs are clear without rales, wheezes, or rhonchi. Chest is nontender. Heart has regular rate and rhythm without murmur. Abdomen is soft, flat, nontender. Extremities have no cyanosis  or edema, full range of motion is present. Skin is warm and dry without rash. Neurologic: Mental status is normal, cranial nerves are intact, strength is 5/5 in all 4 extremities.  Finger-nose testing is normal.  ED Results / Procedures / Treatments    Procedures Procedures    Medications Ordered in ED Medications - No data to display  ED Course/ Medical Decision Making/ A&P                           Medical Decision Making  Minor scalp contusion, no evidence of significant head injury.  Based on [place] CT head injury/trauma rule, patient does not need CT of head.  Patient is advised of this.  He is advised to apply ice, use over-the-counter analgesics as needed for pain.  Return precautions discussed.        Final Clinical Impression(s) / ED Diagnoses Final diagnoses:  Contusion of parietal region of scalp, initial encounter    Rx / DC Orders ED Discharge Orders     None         Delora Fuel, MD [id number]

## 2022-01-02 ENCOUNTER — Emergency Department (HOSPITAL_BASED_OUTPATIENT_CLINIC_OR_DEPARTMENT_OTHER)
Admission: EM | Admit: 2022-01-02 | Discharge: 2022-01-03 | Disposition: A | Discharge status: Home / Self Care | Payer: BC Managed Care – PPO | Attending: Emergency Medicine | Admitting: Emergency Medicine

## 2022-01-02 ENCOUNTER — Encounter (HOSPITAL_BASED_OUTPATIENT_CLINIC_OR_DEPARTMENT_OTHER): Payer: Self-pay

## 2022-01-02 ENCOUNTER — Other Ambulatory Visit: Payer: Self-pay

## 2022-01-02 DIAGNOSIS — M542 Cervicalgia: Secondary | ICD-10-CM | POA: Diagnosis present

## 2022-01-02 DIAGNOSIS — M62838 Other muscle spasm: Secondary | ICD-10-CM | POA: Diagnosis not present

## 2022-01-02 NOTE — ED Triage Notes (Signed)
Pt c/o right sided neck pain/stiffness x 3 days. Pt states it started while eating when he felt a pain behind his right ear go down to the base of his neck, states it made him feel tense in his shoulders.

## 2022-01-03 NOTE — ED Provider Notes (Signed)
[facility] EMERGENCY DEPARTMENT Provider Note   CSN: [id number] Arrival date & time: 01/02/22  2231     History  Chief Complaint  Patient presents with   Neck Pain    Timothy Mack is a 31 y.o. male.  31 yo M with a chief complaints of right-sided neck pain.  Going on for about 3 days now.  Felt that initially behind his right ear and then felt like it went down his neck and into the upper part of his back.  He denies trauma to the area.  Denies radiation down the arm.   Neck Pain      Home Medications Prior to Admission medications   Medication Sig Start Date End Date Taking? Authorizing Provider  cetirizine (ZYRTEC) 10 MG tablet Take 10 mg by mouth daily.    [provider]  fluticasone (FLONASE) 50 MCG/ACT nasal spray Place 2 sprays into both nostrils daily.    [provider]  fluticasone (FLONASE) 50 MCG/ACT nasal spray Place 2 sprays into both nostrils daily. 02/01/21   Arby Barrette, MD  ibuprofen (ADVIL) 600 MG tablet Take 1 tablet (600 mg total) by mouth every 6 (six) hours as needed for moderate pain or mild pain. 01/26/21   Terald Sleeper, MD  loratadine (CLARITIN) 10 MG tablet Take 1 tablet (10 mg total) by mouth daily. 02/01/21   Arby Barrette, MD      Allergies    Patient has no known allergies.    Review of Systems   Review of Systems  Musculoskeletal:  Positive for neck pain.    Physical Exam Updated Vital Signs BP (!) 144/94 (BP Location: Left Arm)   Pulse 90   Temp 98.4 F (36.9 C) (Oral)   Resp 18   Ht 5\' 11"  (1.803 m)   Wt 75.8 kg   SpO2 99%   BMI 23.29 kg/m  Physical Exam Vitals and nursing note reviewed.  Constitutional:      Appearance: He is well-developed.  HENT:     Head: Normocephalic and atraumatic.  Eyes:     Pupils: Pupils are equal, round, and reactive to light.  Neck:     Vascular: No JVD.  Cardiovascular:     Rate and Rhythm: Normal rate and regular rhythm.     Heart sounds: No  murmur heard.    No friction rub. No gallop.  Pulmonary:     Effort: No respiratory distress.     Breath sounds: No wheezing.  Abdominal:     General: There is no distension.     Tenderness: There is no abdominal tenderness. There is no guarding or rebound.  Musculoskeletal:        General: Normal range of motion.     Cervical back: Normal range of motion and neck supple.     Comments: Pain about the right trapezius muscle belly with extension on the attachment of it to the skull.  Skin:    Coloration: Skin is not pale.     Findings: No rash.  Neurological:     Mental Status: He is alert and oriented to person, place, and time.  Psychiatric:        Behavior: Behavior normal.     ED Results / Procedures / Treatments   Labs (all labs ordered are listed, but only abnormal results are displayed) Labs Reviewed - No data to display  EKG None  Radiology No results found.  Procedures Procedures    Medications Ordered in ED  Medications - No data to display  ED Course/ Medical Decision Making/ A&P                           Medical Decision Making  31 yo M with a chief complaints of right-sided neck pain.  Clinically this is a trapezius spasm.  We will treat supportively.  Feel no need for imaging.  Sling for comfort.  PCP follow-up.  2:34 AM:  I have discussed the diagnosis/risks/treatment options with the patient.  Evaluation and diagnostic testing in the emergency department does not suggest an emergent condition requiring admission or immediate intervention beyond what has been performed at this time.  They will follow up with  PCP. We also discussed returning to the ED immediately if new or worsening sx occur. We discussed the sx which are most concerning (e.g., sudden worsening pain, fever, inability to tolerate by mouth) that necessitate immediate return. Medications administered to the patient during their visit and any new prescriptions provided to the patient are listed  below.  Medications given during this visit Medications - No data to display   The patient appears reasonably screen and/or stabilized for discharge and I doubt any other medical condition or other [facility] requiring further screening, evaluation, or treatment in the ED at this time prior to discharge.          Final Clinical Impression(s) / ED Diagnoses Final diagnoses:  Trapezius muscle spasm    Rx / DC Orders ED Discharge Orders     None         Melene Plan, DO 01/03/22 0234

## 2022-01-03 NOTE — Discharge Instructions (Signed)
Take 4 over the counter ibuprofen tablets 3 times a day or 2 over-the-counter naproxen tablets twice a day for pain. Also take tylenol 1000mg(2 extra strength) four times a day.    

## 2022-01-28 ENCOUNTER — Emergency Department (HOSPITAL_BASED_OUTPATIENT_CLINIC_OR_DEPARTMENT_OTHER)
Admission: EM | Admit: 2022-01-28 | Discharge: 2022-01-28 | Disposition: A | Discharge status: Home / Self Care | Payer: BC Managed Care – PPO | Attending: Emergency Medicine | Admitting: Emergency Medicine

## 2022-01-28 ENCOUNTER — Encounter (HOSPITAL_BASED_OUTPATIENT_CLINIC_OR_DEPARTMENT_OTHER): Payer: Self-pay

## 2022-01-28 ENCOUNTER — Other Ambulatory Visit: Payer: Self-pay

## 2022-01-28 ENCOUNTER — Emergency Department (HOSPITAL_BASED_OUTPATIENT_CLINIC_OR_DEPARTMENT_OTHER): Payer: BC Managed Care – PPO

## 2022-01-28 DIAGNOSIS — R079 Chest pain, unspecified: Secondary | ICD-10-CM | POA: Diagnosis present

## 2022-01-28 DIAGNOSIS — I309 Acute pericarditis, unspecified: Secondary | ICD-10-CM | POA: Insufficient documentation

## 2022-01-28 LAB — BASIC METABOLIC PANEL WITH GFR
Anion gap: 5 (ref 5–15)
BUN: 11 mg/dL (ref 6–20)
CO2: 30 mmol/L (ref 22–32)
Calcium: 9.4 mg/dL (ref 8.9–10.3)
Chloride: 103 mmol/L (ref 98–111)
Creatinine, Ser: 1.1 mg/dL (ref 0.61–1.24)
GFR, Estimated: >60 mL/min
Glucose, Bld: 100 mg/dL — ABNORMAL HIGH (ref 70–99)
Potassium: 4 mmol/L (ref 3.5–5.1)
Sodium: 138 mmol/L (ref 135–145)

## 2022-01-28 LAB — TROPONIN I (HIGH SENSITIVITY)
Troponin I (High Sensitivity): 2 ng/L (ref <18)
Troponin I (High Sensitivity): <2 ng/L (ref <18)

## 2022-01-28 LAB — CBC
HCT: 47 % (ref 39.0–52.0)
Hemoglobin: 16.1 g/dL (ref 13.0–17.0)
MCH: 29.9 pg (ref 26.0–34.0)
MCHC: 34.3 g/dL (ref 30.0–36.0)
MCV: 87.2 fL (ref 80.0–100.0)
Platelets: 161 10*3/uL (ref 150–400)
RBC: 5.39 MIL/uL (ref 4.22–5.81)
RDW: 12.5 % (ref 11.5–15.5)
WBC: 5.7 10*3/uL (ref 4.0–10.5)
nRBC: 0 % (ref 0.0–0.2)

## 2022-01-28 MED ORDER — NAPROXEN 500 MG PO TABS
500.0000 mg | ORAL_TABLET | Freq: Two times a day (BID) | ORAL | 0 refills | Status: AC
Start: 2022-01-28 — End: ?

## 2022-01-28 NOTE — ED Triage Notes (Signed)
C/o chest pain radiating to left side of back since yesterday. Today started having a "jolt" in head and felt shaky.

## 2022-01-28 NOTE — ED Provider Notes (Addendum)
[facility] EMERGENCY DEPARTMENT Provider Note   CSN: [id number] Arrival date & time: 01/28/22  1720     History  Chief Complaint  Patient presents with   Chest Pain    Timothy Mack is a 31 y.o. male.   Chest Pain     Pt started having pain in his left chest.  The pain was an ache.  THe pain has been intermittent.  He also had an episode of pain that went form the left temple throughout his body.  He has had some sinus congstion.  No fever chill or cough.  No tobacco use.  No heart or lung problems.   HTN and DM in Family Home Medications Prior to Admission medications   Medication Sig Start Date End Date Taking? Authorizing Provider  naproxen (NAPROSYN) 500 MG tablet Take 1 tablet (500 mg total) by mouth 2 (two) times daily with a meal. As needed for pain 01/28/22  Yes Linwood Dibbles, MD  cetirizine (ZYRTEC) 10 MG tablet Take 10 mg by mouth daily.    [provider]  fluticasone (FLONASE) 50 MCG/ACT nasal spray Place 2 sprays into both nostrils daily.    [provider]  fluticasone (FLONASE) 50 MCG/ACT nasal spray Place 2 sprays into both nostrils daily. 02/01/21   Arby Barrette, MD  ibuprofen (ADVIL) 600 MG tablet Take 1 tablet (600 mg total) by mouth every 6 (six) hours as needed for moderate pain or mild pain. 01/26/21   Terald Sleeper, MD  loratadine (CLARITIN) 10 MG tablet Take 1 tablet (10 mg total) by mouth daily. 02/01/21   Arby Barrette, MD      Allergies    Patient has no known allergies.    Review of Systems   Review of Systems  Cardiovascular:  Positive for chest pain.    Physical Exam Updated Vital Signs BP 121/85   Pulse 68   Temp 98.2 F (36.8 C) (Oral)   Resp 17   Ht 1.803 m (5\' 11" )   Wt 74.4 kg   SpO2 100%   BMI 22.87 kg/m  Physical Exam  ED Results / Procedures / Treatments   Labs (all labs ordered are listed, but only abnormal results are displayed) Labs Reviewed  BASIC METABOLIC PANEL - Abnormal; Notable  for the following components:      Result Value   Glucose, Bld 100 (*)    All other components within normal limits  CBC  TROPONIN I (HIGH SENSITIVITY)  TROPONIN I (HIGH SENSITIVITY)    EKG EKG Interpretation  Date/Time:  Monday January 28 2022 17:29:26 EDT Ventricular Rate:  84 PR Interval:  148 QRS Duration: 84 QT Interval:  336 QTC Calculation: 397 R Axis:   78 Text Interpretation: Normal sinus rhythm with sinus arrhythmia ST elevation, consider early repolarization . increased since last tracing Borderline ECG When compared with ECG of 25-Apr-2021 00:46, PREVIOUS ECG IS PRESENT Confirmed by 27-Apr-2021 [phone]) on 01/28/2022 5:40:23 PM  Radiology DG Chest 2 View  Result Date: 01/28/2022 CLINICAL DATA:  Chest pain EXAM: CHEST - 2 VIEW COMPARISON:  04/25/2021 FINDINGS: The heart size and mediastinal contours are within normal limits. Both lungs are clear. The visualized skeletal structures are unremarkable. IMPRESSION: No active cardiopulmonary disease. Electronically Signed   By: 06/25/2021 D.O.   On: 01/28/2022 17:49    Procedures Procedures    Medications Ordered in ED Medications - No data to display  ED Course/ Medical Decision Making/ A&P Clinical Course as  of 01/28/22 2306  Mon Jan 28, 2022  2121 CBC nl [JK]  2121 Basic metabolic panel(!) nl [JK]  2121 Troponin I (High Sensitivity) nl [JK]  2121 DG Chest 2 View nl [JK]    Clinical Course User Index [JK] Linwood Dibbles, MD           HEART Score: 1                Medical Decision Making Differential diagnosis includes but not limited to pneumonia or pneumothorax, cardiac ischemia or pericarditis  Problems Addressed: Acute pericarditis, unspecified type: acute illness or injury  Amount and/or Complexity of Data Reviewed Labs: ordered. Decision-making details documented in ED Course. Radiology: ordered and independent interpretation performed. Decision-making details documented in ED  Course.  Risk Prescription drug management.  Patient presented to the ED for evaluation of chest pain.  Symptoms atypical for ACS.  Low risk heart score.  Serial troponins normal.  Doubt cardiac ischemia.  EKG changes suggestive of pericarditis.  Patient has noticed a sharp component to his pain.  Somewhat positional.  Will discharge home on course of NSAIDs.  Low risk for PE, PERC negative  Evaluation and diagnostic testing in the emergency department does not suggest an emergent condition requiring admission or immediate intervention beyond what has been performed at this time.  The patient is safe for discharge and has been instructed to return immediately for worsening symptoms, change in symptoms or any other concerns.     Final Clinical Impression(s) / ED Diagnoses Final diagnoses:  Acute pericarditis, unspecified type    Rx / DC Orders ED Discharge Orders          Ordered    naproxen (NAPROSYN) 500 MG tablet  2 times daily with meals        01/28/22 2304                Linwood Dibbles, MD 01/28/22 2307

## 2022-01-28 NOTE — Discharge Instructions (Signed)
Take the medications as prescribed to help with the pain and discomfort.  Follow-up with your primary care doctor next week to be rechecked.  Return for fevers chills shortness of breath or other concerning symptoms

## 2022-11-27 IMAGING — DX DG CHEST 2V
2 series · 2 of 2 positions shown · non-contrast
Comparison: February 25, 2021

CLINICAL DATA: Right-sided chest pain.

EXAM:
CHEST - 2 VIEW

[chest pa]
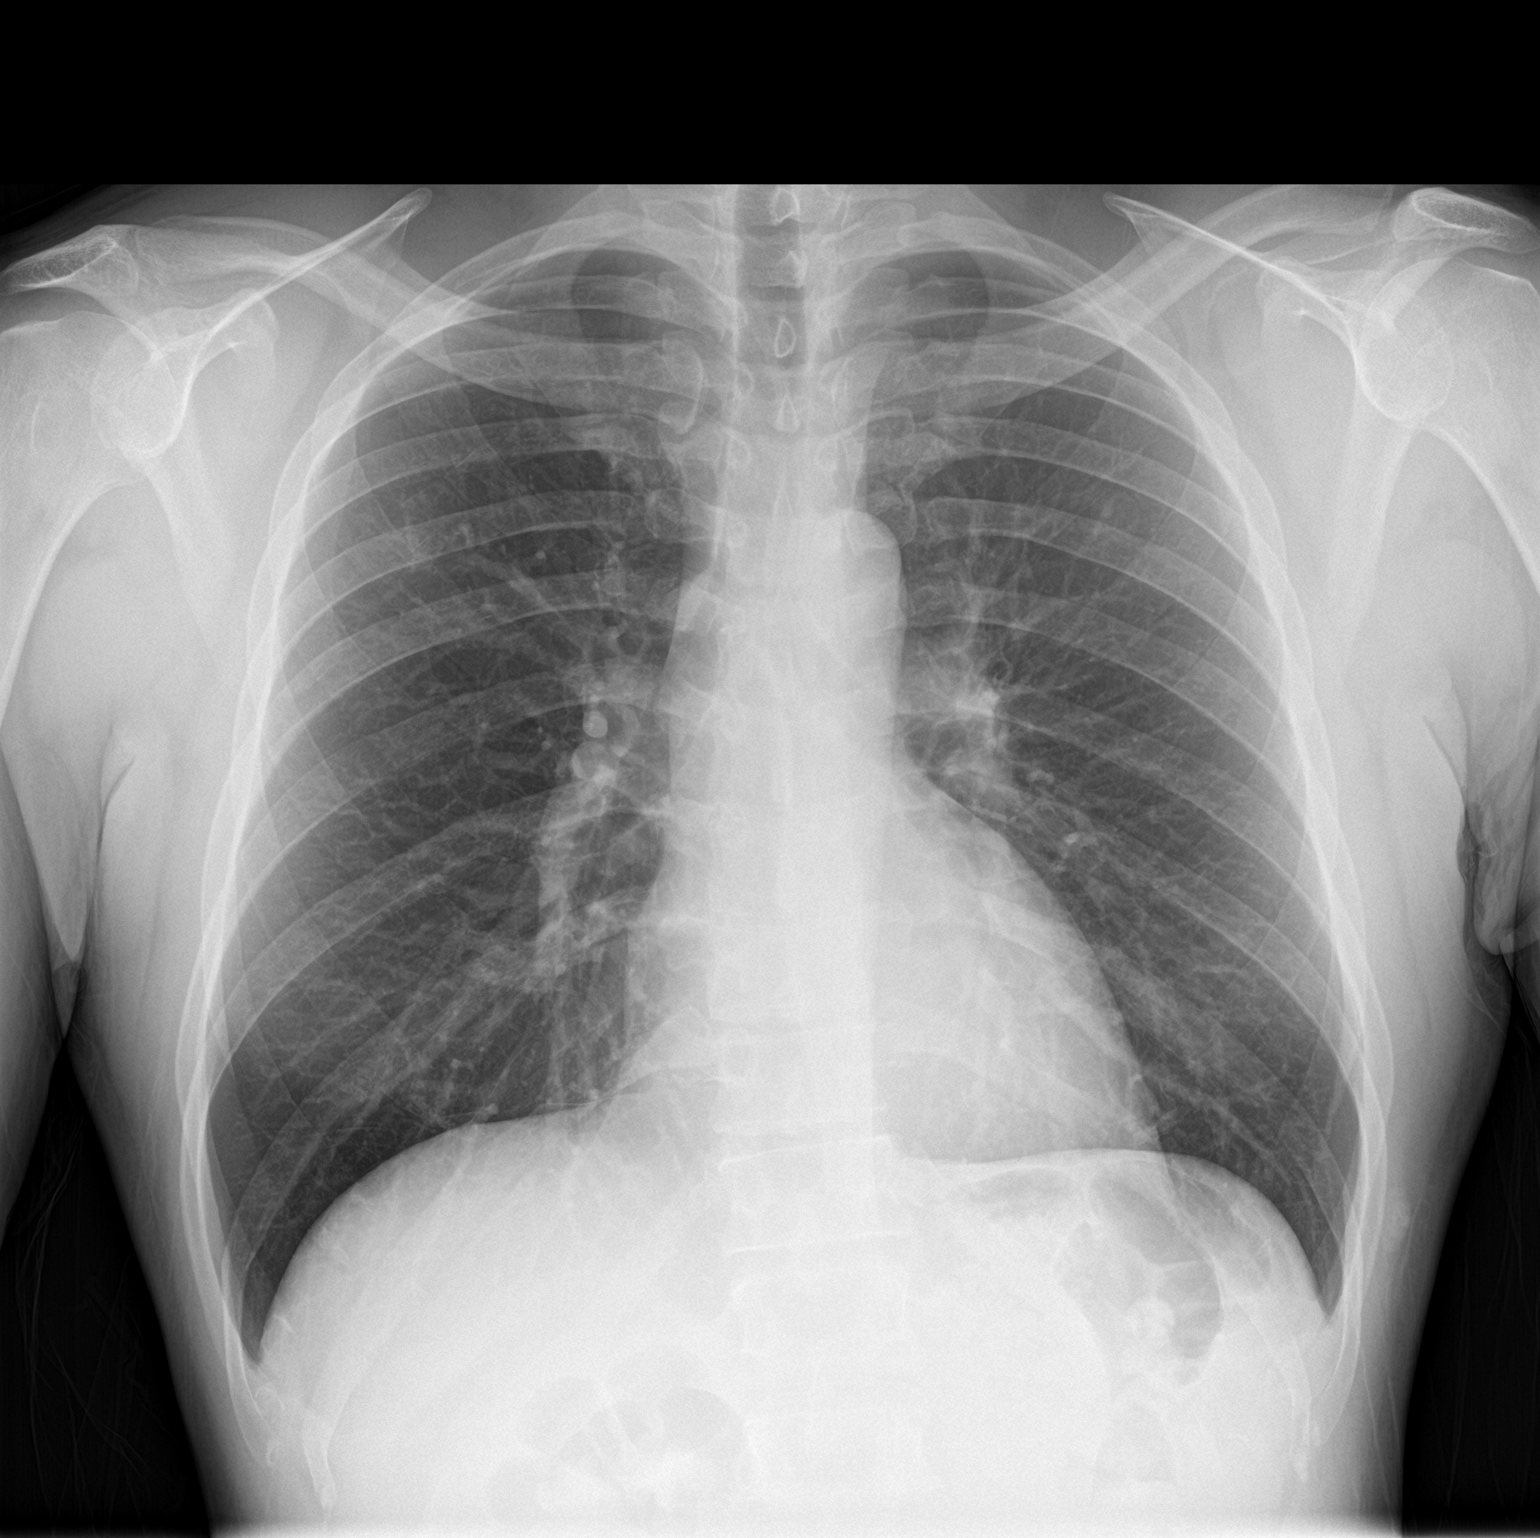

[chest lat]
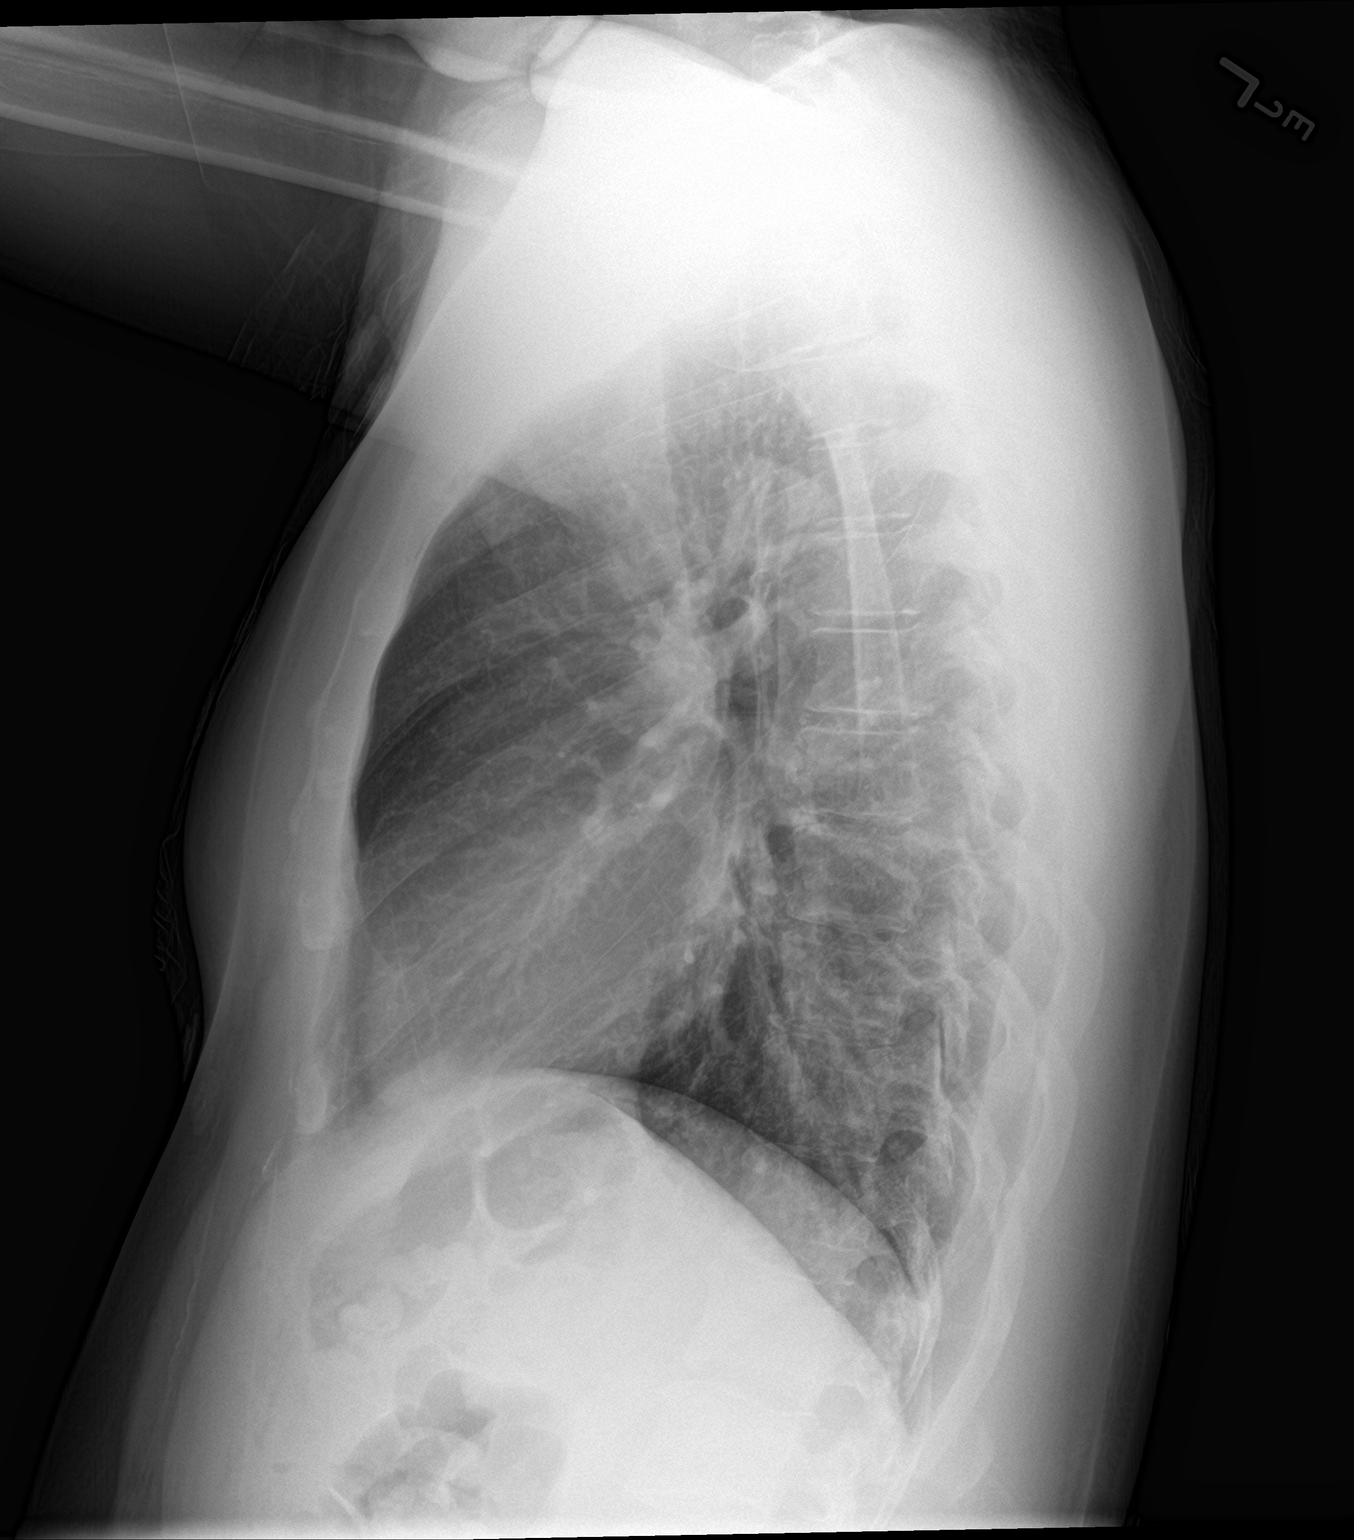

[2 of 2 positions shown; findings below may reference images not displayed]

FINDINGS: The heart size and mediastinal contours are within normal limits.
Both lungs are clear. The visualized skeletal structures are
unremarkable.
IMPRESSION: No active cardiopulmonary disease.
